# Patient Record
Sex: Male | Born: 1970 | Race: White | Hispanic: No | Marital: Married | Smoking: Never smoker
Health system: Southern US, Community
[De-identification: ages and names within clinical notes are randomized; demographics above are authoritative.]

## PROBLEM LIST (undated history)

## (undated) DIAGNOSIS — T7840XA Allergy, unspecified, initial encounter: Secondary | ICD-10-CM

## (undated) DIAGNOSIS — G4730 Sleep apnea, unspecified: Secondary | ICD-10-CM

## (undated) DIAGNOSIS — K219 Gastro-esophageal reflux disease without esophagitis: Secondary | ICD-10-CM

## (undated) DIAGNOSIS — B001 Herpesviral vesicular dermatitis: Secondary | ICD-10-CM

## (undated) HISTORY — DX: Herpesviral vesicular dermatitis: B00.1

## (undated) HISTORY — PX: APPENDECTOMY: SHX54

## (undated) HISTORY — DX: Allergy, unspecified, initial encounter: T78.40XA

## (undated) HISTORY — DX: Sleep apnea, unspecified: G47.30

## (undated) HISTORY — DX: Gastro-esophageal reflux disease without esophagitis: K21.9

## (undated) HISTORY — PX: NASAL SINUS SURGERY: SHX719

---

## 1998-06-04 ENCOUNTER — Emergency Department (HOSPITAL_COMMUNITY): Admission: EM | Admit: 1998-06-04 | Discharge: 1998-06-04 | Payer: Self-pay | Admitting: Emergency Medicine

## 2001-05-15 ENCOUNTER — Ambulatory Visit (HOSPITAL_BASED_OUTPATIENT_CLINIC_OR_DEPARTMENT_OTHER): Admission: RE | Admit: 2001-05-15 | Discharge: 2001-05-15 | Payer: Self-pay | Admitting: Pulmonary Disease

## 2002-10-24 ENCOUNTER — Emergency Department (HOSPITAL_COMMUNITY): Admission: EM | Admit: 2002-10-24 | Discharge: 2002-10-24 | Payer: Self-pay | Admitting: Emergency Medicine

## 2002-10-24 ENCOUNTER — Encounter: Payer: Self-pay | Admitting: Emergency Medicine

## 2003-08-08 ENCOUNTER — Emergency Department (HOSPITAL_COMMUNITY): Admission: EM | Admit: 2003-08-08 | Discharge: 2003-08-08 | Payer: Self-pay | Admitting: Emergency Medicine

## 2003-11-21 ENCOUNTER — Emergency Department (HOSPITAL_COMMUNITY): Admission: EM | Admit: 2003-11-21 | Discharge: 2003-11-21 | Payer: Self-pay | Admitting: Emergency Medicine

## 2004-02-24 ENCOUNTER — Emergency Department (HOSPITAL_COMMUNITY): Admission: EM | Admit: 2004-02-24 | Discharge: 2004-02-24 | Payer: Self-pay | Admitting: Emergency Medicine

## 2004-11-28 ENCOUNTER — Ambulatory Visit: Payer: Self-pay | Admitting: Pulmonary Disease

## 2005-03-07 ENCOUNTER — Ambulatory Visit: Payer: Self-pay | Admitting: Pulmonary Disease

## 2005-03-27 ENCOUNTER — Ambulatory Visit (HOSPITAL_BASED_OUTPATIENT_CLINIC_OR_DEPARTMENT_OTHER): Admission: RE | Admit: 2005-03-27 | Discharge: 2005-03-27 | Payer: Self-pay | Admitting: Pulmonary Disease

## 2005-04-04 ENCOUNTER — Ambulatory Visit: Payer: Self-pay | Admitting: Pulmonary Disease

## 2005-04-18 ENCOUNTER — Ambulatory Visit: Payer: Self-pay | Admitting: Pulmonary Disease

## 2005-11-29 ENCOUNTER — Ambulatory Visit: Payer: Self-pay | Admitting: Family Medicine

## 2006-02-07 ENCOUNTER — Ambulatory Visit: Payer: Self-pay | Admitting: Family Medicine

## 2006-03-07 ENCOUNTER — Ambulatory Visit: Payer: Self-pay | Admitting: Family Medicine

## 2006-03-14 ENCOUNTER — Ambulatory Visit: Payer: Self-pay | Admitting: Family Medicine

## 2006-04-22 ENCOUNTER — Ambulatory Visit: Payer: Self-pay | Admitting: Family Medicine

## 2006-06-14 ENCOUNTER — Ambulatory Visit: Payer: Self-pay | Admitting: Family Medicine

## 2006-07-01 ENCOUNTER — Ambulatory Visit: Payer: Self-pay | Admitting: Family Medicine

## 2006-09-24 ENCOUNTER — Ambulatory Visit: Payer: Self-pay | Admitting: Family Medicine

## 2007-01-16 ENCOUNTER — Ambulatory Visit: Payer: Self-pay | Admitting: Family Medicine

## 2007-03-27 ENCOUNTER — Ambulatory Visit: Payer: Self-pay | Admitting: Family Medicine

## 2007-04-10 ENCOUNTER — Ambulatory Visit: Payer: Self-pay | Admitting: Family Medicine

## 2007-05-29 ENCOUNTER — Ambulatory Visit: Payer: Self-pay | Admitting: Family Medicine

## 2007-08-19 ENCOUNTER — Ambulatory Visit: Payer: Self-pay | Admitting: Family Medicine

## 2007-10-07 ENCOUNTER — Emergency Department: Admit: 2007-10-07 | Payer: Self-pay | Source: Emergency Department | Admitting: Emergency Medicine

## 2007-10-07 LAB — CBC AND DIFFERENTIAL
Basophils Absolute: 0 /mm3 (ref 0.0–0.2)
Basophils: 0 % (ref 0–2)
Eosinophils Absolute: 0.1 /mm3 (ref 0.0–0.7)
Eosinophils: 2 % (ref 0–5)
Granulocytes Absolute: 3.8 /mm3 (ref 1.8–8.1)
Hematocrit: 42.7 % (ref 42.0–52.0)
Hgb: 14 G/DL (ref 13.0–17.0)
Immature Granulocytes Absolute: 0 CUMM (ref 0.0–0.0)
Immature Granulocytes: 0 % (ref 0–1)
Lymphocytes Absolute: 0.9 /mm3 (ref 0.5–4.4)
Lymphocytes: 17 % (ref 15–41)
MCH: 28.5 PG (ref 28.0–32.0)
MCHC: 32.8 G/DL (ref 32.0–36.0)
MCV: 87 FL (ref 80.0–100.0)
MPV: 9.1 FL — ABNORMAL LOW (ref 9.4–12.3)
Monocytes Absolute: 0.5 /mm3 (ref 0.0–1.2)
Monocytes: 10 % (ref 0–11)
Neutrophils %: 71 % (ref 52–75)
Platelets: 285 /mm3 (ref 140–400)
RBC: 4.91 /mm3 (ref 4.70–6.00)
RDW: 14.2 % (ref 11.5–15.0)
WBC: 5.33 /mm3 (ref 3.50–10.80)

## 2007-10-07 LAB — BASIC METABOLIC PANEL
BUN: 15 MG/DL (ref 7–21)
CO2: 28 MEQ/L (ref 22–31)
Calcium: 9 MG/DL (ref 8.6–10.2)
Chloride: 102 MEQ/L (ref 98–107)
Creatinine: 0.8 MG/DL (ref 0.5–1.4)
Glucose: 97 MG/DL (ref 70–105)
Potassium: 3.8 MEQ/L (ref 3.6–5.0)
Sodium: 140 MEQ/L (ref 136–143)

## 2007-10-07 LAB — GFR

## 2007-10-07 LAB — TROPONIN I QUANTITATIVE LEVEL CERNER
Troponin I: 0.01 ng/mL (ref 0.00–0.03)
Troponin I: 0.01 ng/mL (ref 0.00–0.03)

## 2007-10-07 LAB — PT AND APTT
PT INR: 1.2 {INR} — ABNORMAL HIGH (ref 0.9–1.1)
PT: 14 s — ABNORMAL HIGH (ref 10.8–13.3)
PTT: 26 s (ref 21–32)

## 2007-10-07 LAB — CREATINE KINASE W/O REFLEX (SOFT): Creatine Kinase (CK): 81 U/L (ref 20–297)

## 2008-03-05 ENCOUNTER — Ambulatory Visit: Payer: Self-pay | Admitting: Family Medicine

## 2008-04-30 ENCOUNTER — Ambulatory Visit: Payer: Self-pay | Admitting: Family Medicine

## 2008-07-29 ENCOUNTER — Ambulatory Visit: Payer: Self-pay | Admitting: Family Medicine

## 2008-11-25 ENCOUNTER — Ambulatory Visit: Payer: Self-pay | Admitting: Family Medicine

## 2009-03-28 ENCOUNTER — Encounter: Admission: RE | Admit: 2009-03-28 | Discharge: 2009-03-28 | Payer: Self-pay | Admitting: Family Medicine

## 2009-03-28 ENCOUNTER — Ambulatory Visit: Payer: Self-pay | Admitting: Family Medicine

## 2009-03-29 ENCOUNTER — Encounter: Admission: RE | Admit: 2009-03-29 | Discharge: 2009-03-29 | Payer: Self-pay | Admitting: Family Medicine

## 2009-06-25 ENCOUNTER — Emergency Department: Admit: 2009-06-25 | Payer: Self-pay | Source: Emergency Department | Admitting: Family

## 2009-06-25 LAB — URINALYSIS WITH MICROSCOPIC
Bilirubin, UA: NEGATIVE
Blood, UA: NEGATIVE
Glucose, UA: NEGATIVE
Ketones UA: NEGATIVE
Nitrite, UA: NEGATIVE
Protein, UR: NEGATIVE
Specific Gravity UA POCT: 1.015 (ref 1.001–1.035)
Urine pH: 5 (ref 5.0–8.0)
Urobilinogen, UA: NORMAL mg/dL

## 2009-06-25 LAB — COMPREHENSIVE METABOLIC PANEL
ALT: 38 U/L (ref 21–72)
AST (SGOT): 30 U/L (ref 20–57)
Albumin/Globulin Ratio: 1.3 (ref 1.1–1.8)
Albumin: 3.9 g/dL (ref 3.7–5.1)
Alkaline Phosphatase: 69 U/L (ref 43–122)
BUN: 12 mg/dL (ref 7–21)
Bilirubin, Total: 0.3 mg/dL (ref 0.2–1.3)
CO2: 29 mEq/L (ref 22–31)
Calcium: 9.1 mg/dL (ref 8.6–10.2)
Chloride: 100 mEq/L (ref 98–107)
Creatinine: 1 mg/dL (ref 0.5–1.4)
Globulin: 3.1 g/dL (ref 2.0–3.7)
Glucose: 88 mg/dL (ref 70–100)
Potassium: 4 mEq/L (ref 3.6–5.0)
Protein, Total: 7 g/dL (ref 6.0–8.0)
Sodium: 140 mEq/L (ref 136–143)

## 2009-06-25 LAB — CBC AND DIFFERENTIAL
Basophils %: 0 % (ref 0–2)
Basophils Absolute: 0 10*3/uL (ref 0.00–0.20)
Eosinophils %: 3 % (ref 0–5)
Eosinophils Absolute: 0.2 10*3/uL (ref 0.00–0.70)
Granulocytes Absolute: 3.5 10*3/uL (ref 1.80–8.10)
Hematocrit: 42.9 % (ref 42.0–52.0)
Hgb: 14.4 g/dL (ref 13.0–17.0)
Immature Granulocytes Absolute: 0.01 10*3/uL
Immature Granulocytes: 0 % (ref 0–1)
Lymphocytes Absolute: 1 10*3/uL (ref 0.50–4.40)
Lymphocytes: 18 % (ref 15–41)
MCH: 29.3 pg (ref 28.0–32.0)
MCHC: 33.6 g/dL (ref 32.0–36.0)
MCV: 87.2 fL (ref 80.0–100.0)
MPV: 9.1 fL — ABNORMAL LOW (ref 9.4–12.3)
Monocytes Absolute: 0.9 10*3/uL (ref 0.00–1.20)
Monocytes: 16 % — ABNORMAL HIGH (ref 0–11)
Neutrophils %: 63 % (ref 52–75)
Platelets: 257 10*3/uL (ref 140–400)
RBC: 4.92 10*6/uL (ref 4.70–6.00)
RDW: 14 % (ref 12–15)
WBC: 5.56 10*3/uL (ref 3.50–10.80)

## 2009-06-25 LAB — GFR: EGFR: >60

## 2009-06-25 LAB — LIPASE: Lipase: 103 U/L (ref 23–300)

## 2009-06-25 LAB — AMYLASE: Amylase: 37 U/L (ref 30–110)

## 2009-08-22 ENCOUNTER — Ambulatory Visit: Payer: Self-pay | Admitting: Family Medicine

## 2009-09-15 ENCOUNTER — Ambulatory Visit: Payer: Self-pay | Admitting: Family Medicine

## 2009-09-22 ENCOUNTER — Ambulatory Visit: Payer: Self-pay | Admitting: Family Medicine

## 2009-11-17 ENCOUNTER — Ambulatory Visit: Payer: Self-pay | Admitting: Family Medicine

## 2010-01-04 ENCOUNTER — Ambulatory Visit: Payer: Self-pay | Admitting: Family Medicine

## 2010-04-21 ENCOUNTER — Ambulatory Visit: Payer: Self-pay | Admitting: Family Medicine

## 2010-05-29 ENCOUNTER — Ambulatory Visit: Payer: Self-pay | Admitting: Family Medicine

## 2010-06-20 ENCOUNTER — Ambulatory Visit
Admission: RE | Admit: 2010-06-20 | Discharge: 2010-06-20 | Payer: Self-pay | Source: Home / Self Care | Attending: Family Medicine | Admitting: Family Medicine

## 2010-07-24 ENCOUNTER — Ambulatory Visit
Admission: RE | Admit: 2010-07-24 | Discharge: 2010-07-24 | Payer: Self-pay | Source: Home / Self Care | Attending: Family Medicine | Admitting: Family Medicine

## 2010-11-10 NOTE — Procedures (Signed)
Alan Grimes, Alan Grimes NO.:  [id number]   MEDICAL RECORD NO.:  [id number]          PATIENT TYPE:  OUT   LOCATION:  SLEEP CENTER                 FACILITY:  [facility]   PHYSICIAN:  Marcelyn Bruins, M.D. [facility] DATE OF BIRTH:  1971/03/26   DATE OF STUDY:  03/27/2005                              NOCTURNAL POLYSOMNOGRAM   REFERRING PHYSICIAN:  Danice Goltz, M.D.   DATE OF STUDY:  March 27, 2005.   INDICATIONS FOR STUDY:  Hypersomnia with some sleep apnea.   EPWORTH SCORE:  6.   SLEEP ARCHITECTURE:  The patient has total sleep time of 383 minutes with  adequate slow wave sleep and REM in lower limits of normal. Sleep onset  latency was at the upper limits of normal, and REM onset was prolonged.  Sleep efficiency was 91%.   RESPIRATORY DATA:  The patient was found to have apneas and hypopneas for a  respiratory disturbance index 18 events per hour. These occurred in all body  positions, and there was loud snoring noted.   OXYGEN DATA:  The patient had O2 desaturation as low as 88% associated with  his obstructive events.   CARDIAC DATA:  No clinically significant cardiac arrhythmias.   MOVEMENT/PARASOMNIAS:  The patient was found to have small numbers of leg  jerks with very mild sleep disruption. Clinical correlation is suggested if  he does not respond appropriately to treatment of his obstructive sleep  apnea.   IMPRESSION:  1.  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  Treatment options include weigh loss, upper airway surgery,  oral appliance, or CPAP.           ______________________________  Marcelyn Bruins, M.D. [facility]  Diplomate, American Board of Sleep  Medicine     KC/MEDQ  D:  04/04/2005 12:17:19  T:  04/04/2005 14:08:21  Job:  [id number]

## 2010-11-23 ENCOUNTER — Encounter: Payer: Self-pay | Admitting: Family Medicine

## 2010-11-23 DIAGNOSIS — G4730 Sleep apnea, unspecified: Secondary | ICD-10-CM | POA: Insufficient documentation

## 2010-11-24 ENCOUNTER — Encounter: Payer: Self-pay | Admitting: Family Medicine

## 2010-11-24 ENCOUNTER — Ambulatory Visit (INDEPENDENT_AMBULATORY_CARE_PROVIDER_SITE_OTHER): Payer: Managed Care, Other (non HMO) | Admitting: Family Medicine

## 2010-11-24 ENCOUNTER — Telehealth: Payer: Self-pay | Admitting: Family Medicine

## 2010-11-24 VITALS — BP 120/82 | HR 60 | Ht 69.0 in | Wt 179.0 lb

## 2010-11-24 DIAGNOSIS — J301 Allergic rhinitis due to pollen: Secondary | ICD-10-CM

## 2010-11-24 DIAGNOSIS — Z0000 Encounter for general adult medical examination without abnormal findings: Secondary | ICD-10-CM

## 2010-11-24 DIAGNOSIS — G4730 Sleep apnea, unspecified: Secondary | ICD-10-CM

## 2010-11-24 DIAGNOSIS — J45909 Unspecified asthma, uncomplicated: Secondary | ICD-10-CM

## 2010-11-24 DIAGNOSIS — A6000 Herpesviral infection of urogenital system, unspecified: Secondary | ICD-10-CM

## 2010-11-24 DIAGNOSIS — A6002 Herpesviral infection of other male genital organs: Secondary | ICD-10-CM

## 2010-11-24 DIAGNOSIS — K219 Gastro-esophageal reflux disease without esophagitis: Secondary | ICD-10-CM

## 2010-11-24 LAB — CBC WITH DIFFERENTIAL/PLATELET
Basophils Relative: 1 % (ref 0–1)
HCT: 43.5 % (ref 39.0–52.0)
Hemoglobin: 14.7 g/dL (ref 13.0–17.0)
Lymphocytes Relative: 25 % (ref 12–46)
Lymphs Abs: 1.6 10*3/uL (ref 0.7–4.0)
MCHC: 33.8 g/dL (ref 30.0–36.0)
Monocytes Absolute: 0.6 10*3/uL (ref 0.1–1.0)
Monocytes Relative: 9 % (ref 3–12)
Neutro Abs: 3.4 10*3/uL (ref 1.7–7.7)
Neutrophils Relative %: 54 % (ref 43–77)
RBC: 5.05 MIL/uL (ref 4.22–5.81)

## 2010-11-24 LAB — COMPREHENSIVE METABOLIC PANEL
Albumin: 4.4 g/dL (ref 3.5–5.2)
BUN: 17 mg/dL (ref 6–23)
CO2: 28 mEq/L (ref 19–32)
Calcium: 9.9 mg/dL (ref 8.4–10.5)
Chloride: 104 mEq/L (ref 96–112)
Glucose, Bld: 87 mg/dL (ref 70–99)
Potassium: 4.2 mEq/L (ref 3.5–5.3)
Sodium: 140 mEq/L (ref 135–145)
Total Protein: 7.3 g/dL (ref 6.0–8.3)

## 2010-11-24 LAB — POCT URINALYSIS DIPSTICK
Glucose, UA: NEGATIVE
Ketones, UA: NEGATIVE
Leukocytes, UA: NEGATIVE
Protein, UA: NEGATIVE
Spec Grav, UA: 1.02

## 2010-11-24 LAB — LIPID PANEL
Cholesterol: 177 mg/dL (ref 0–200)
HDL: 41 mg/dL (ref >39)
Triglycerides: 135 mg/dL (ref <150)

## 2010-11-24 MED ORDER — BUDESONIDE 32 MCG/ACT NA SUSP: 2.0000 | Freq: Every day | NASAL | Status: DC

## 2010-11-24 MED ORDER — FLUTICASONE-SALMETEROL 250-50 MCG/DOSE IN AEPB: 1.0000 | INHALATION_SPRAY | Freq: Two times a day (BID) | RESPIRATORY_TRACT | Status: DC

## 2010-11-24 NOTE — Progress Notes (Signed)
  Subjective:    Patient ID: Alan Grimes, male    DOB: 05/11/1971, 40 y.o.   MRN: [id number]  HPI He is here for complete examination. He does have history of allergies and asthma but at the present time is not using his nasal steroid spray or his Advair. He also has a history of sleep apnea and is not using CPAP do to intolerance to this. 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 He also continues on Celexa and Wellbutrin which is going well. He continues on medicines for his reflux. He is now in a long-term relationship which seems to be going well . He still travels with his job however he is having difficulty with weight maintenance.   Review of Systems  Constitutional: Negative.   Eyes: Negative.   Cardiovascular: Negative.   Gastrointestinal: Negative.   Genitourinary: Negative.   Musculoskeletal: Negative.   Neurological: Negative.   Hematological: Negative.        Objective:   Physical ExamBP 120/82  Pulse 60  Ht 5\' 9"  (1.753 m)  Wt 179 lb (81.194 kg)  BMI 26.43 kg/m2  General Appearance:    Alert, cooperative, no distress, appears stated age  Head:    Normocephalic, without obvious abnormality, atraumatic  Eyes:    PERRL, conjunctiva/corneas clear, EOM's intact, fundi    benign  Ears:    both TMs have right changes   Nose:   Nares normal, mucosa normal, no drainage or sinus   tenderness  Throat:   Lips, mucosa, and tongue normal; teeth and gums normal  Neck:   Supple, no lymphadenopathy;  thyroid:  no   enlargement/tenderness/nodules; no carotid   bruit or JVD  Back:    Spine nontender, no curvature, ROM normal, no CVA     tenderness  Lungs:     show scattered rhonchi ,respirations unlabored  Chest Wall:    No tenderness or deformity   Heart:    Regular rate and rhythm, S1 and S2 normal, no murmur, rub   or gallop  Breast Exam:    No chest wall tenderness, masses or gynecomastia  Abdomen:     Soft, non-tender, nondistended,  normoactive bowel sounds,    no masses, no hepatosplenomegaly  Genitalia:    Normal male external genitalia without lesions.  Testicles without masses.  No inguinal hernias.  Rectal:    Normal sphincter tone, no masses or tenderness; guaiac negative stool.  Prostate smooth, no nodules, not enlarged.  Extremities:   No clubbing, cyanosis or edema  Pulses:   2+ and symmetric all extremities  Skin:   Skin color, texture, turgor normal, no rashes or lesions  Lymph nodes:   Cervical, supraclavicular, and axillary nodes normal  Neurologic:   CNII-XII intact, normal strength, sensation and gait; reflexes 2+ and symmetric throughout          Psych:   Normal mood, affect, hygiene and grooming.            Assessment & Plan:  Allergic rhinitis. Asthma. GERD. Herpes labialis. Sleep apnea. I discussed weight gain with him in regard to diet and exercise. Strongly encouraged to make changes in both areas. I will do routine blood screening on him. I will also renew his Advair which he has stopped on his own and his Rhinocort. 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 At this point he also does not want to pursue sleep apnea testing or CPAP.

## 2011-02-13 NOTE — Telephone Encounter (Signed)
NOTED

## 2011-03-15 ENCOUNTER — Telehealth: Payer: Self-pay | Admitting: Family Medicine

## 2011-03-16 ENCOUNTER — Other Ambulatory Visit: Payer: Self-pay | Admitting: *Deleted

## 2011-03-16 NOTE — Telephone Encounter (Signed)
Called in Albuterol Sulfate inhaler, 1-2 puffs as needed with no refills to [facility] [place] at [phone].  CM, LPN

## 2011-03-19 ENCOUNTER — Other Ambulatory Visit: Payer: Self-pay

## 2011-03-19 MED ORDER — ALBUTEROL SULFATE HFA 108 (90 BASE) MCG/ACT IN AERS: 2.0000 | INHALATION_SPRAY | Freq: Four times a day (QID) | RESPIRATORY_TRACT | Status: DC | PRN

## 2011-03-19 NOTE — Telephone Encounter (Signed)
surescripte pt albuteral to [facility]

## 2011-03-20 NOTE — Telephone Encounter (Signed)
DONE

## 2011-03-24 IMAGING — CR DG CHEST 2V
2 series · 2 of 2 positions shown · non-contrast
Comparison: 11/21/2003

CLINICAL DATA: Fatigue, cough and chest pain.

CHEST - 2 VIEW

[w chest pa]
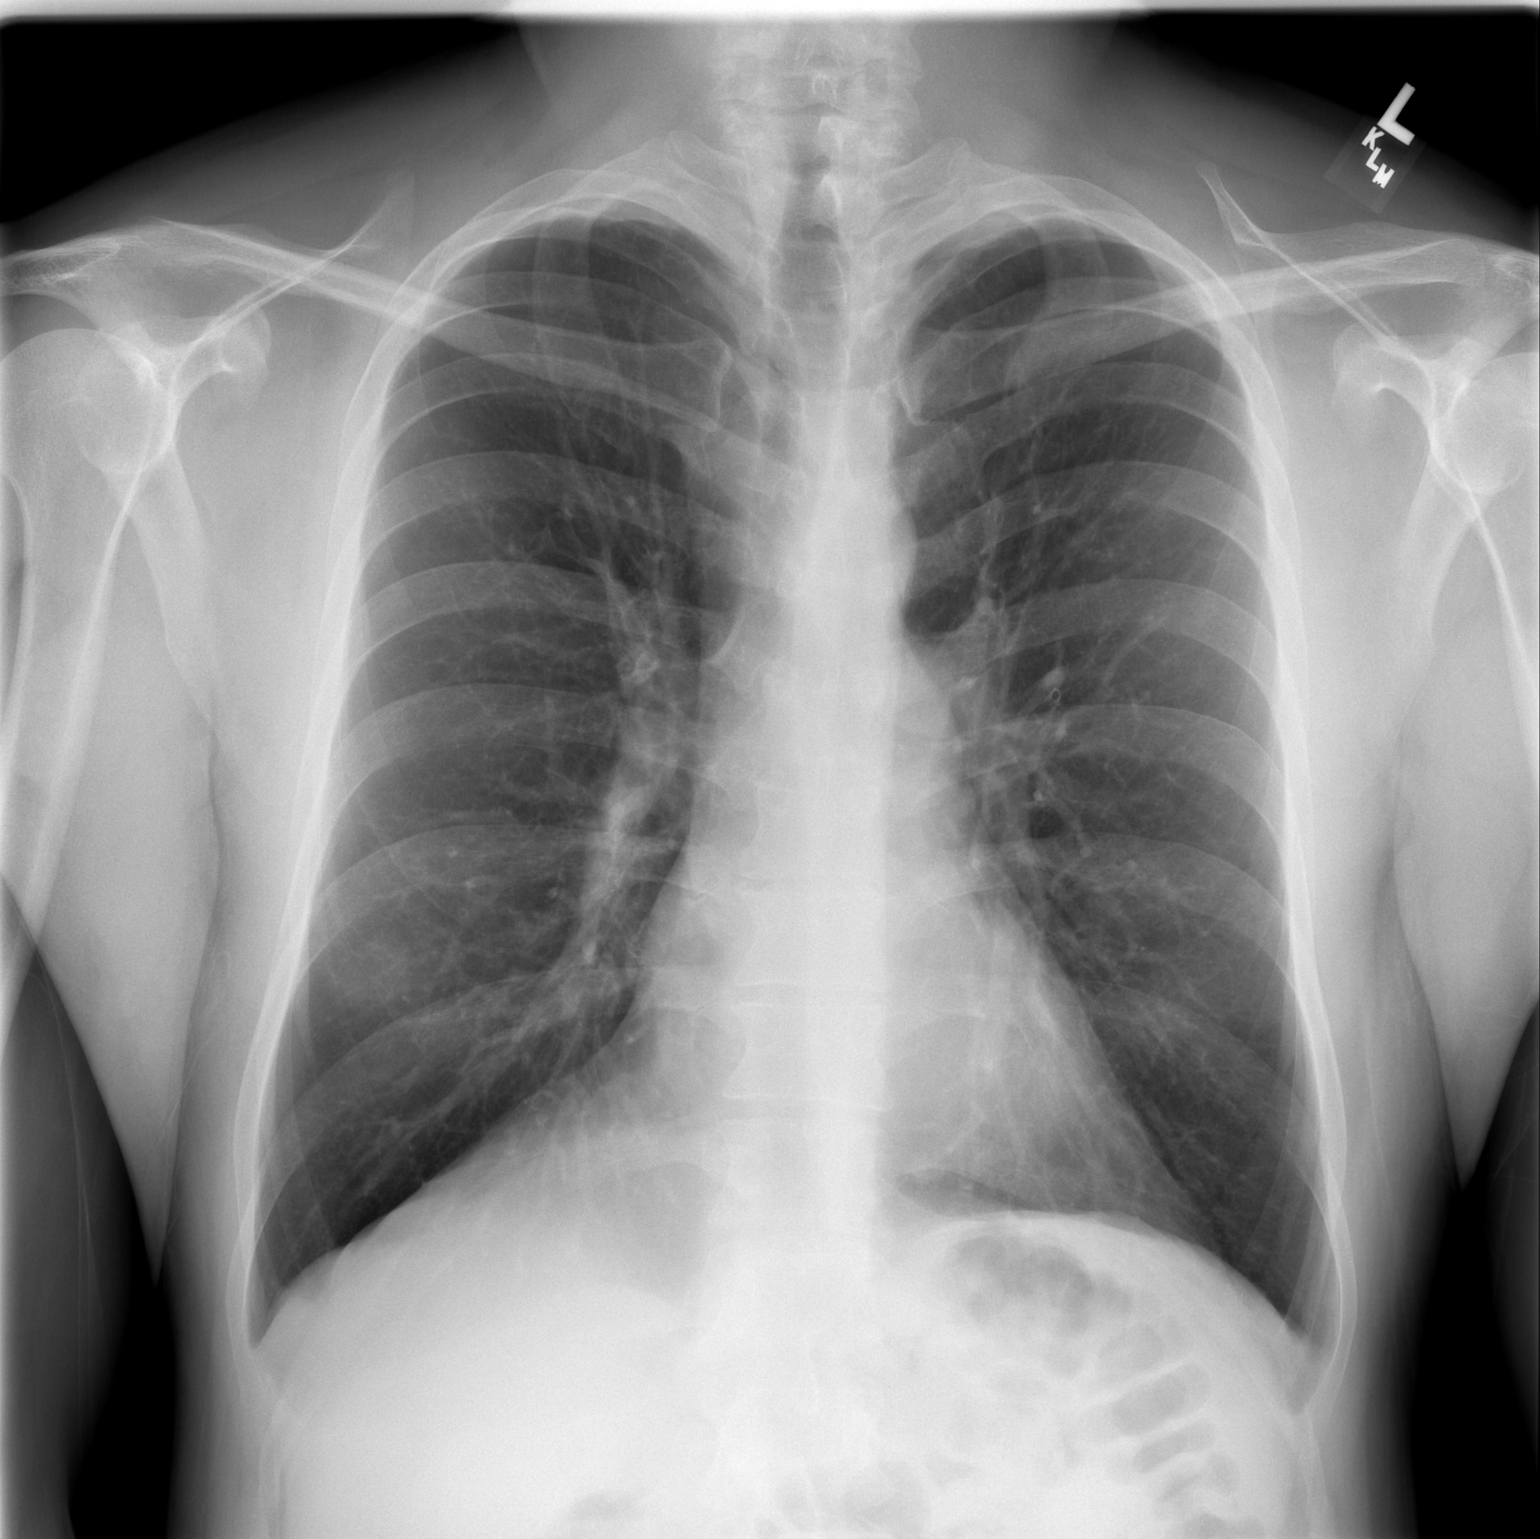

[w chest lat]
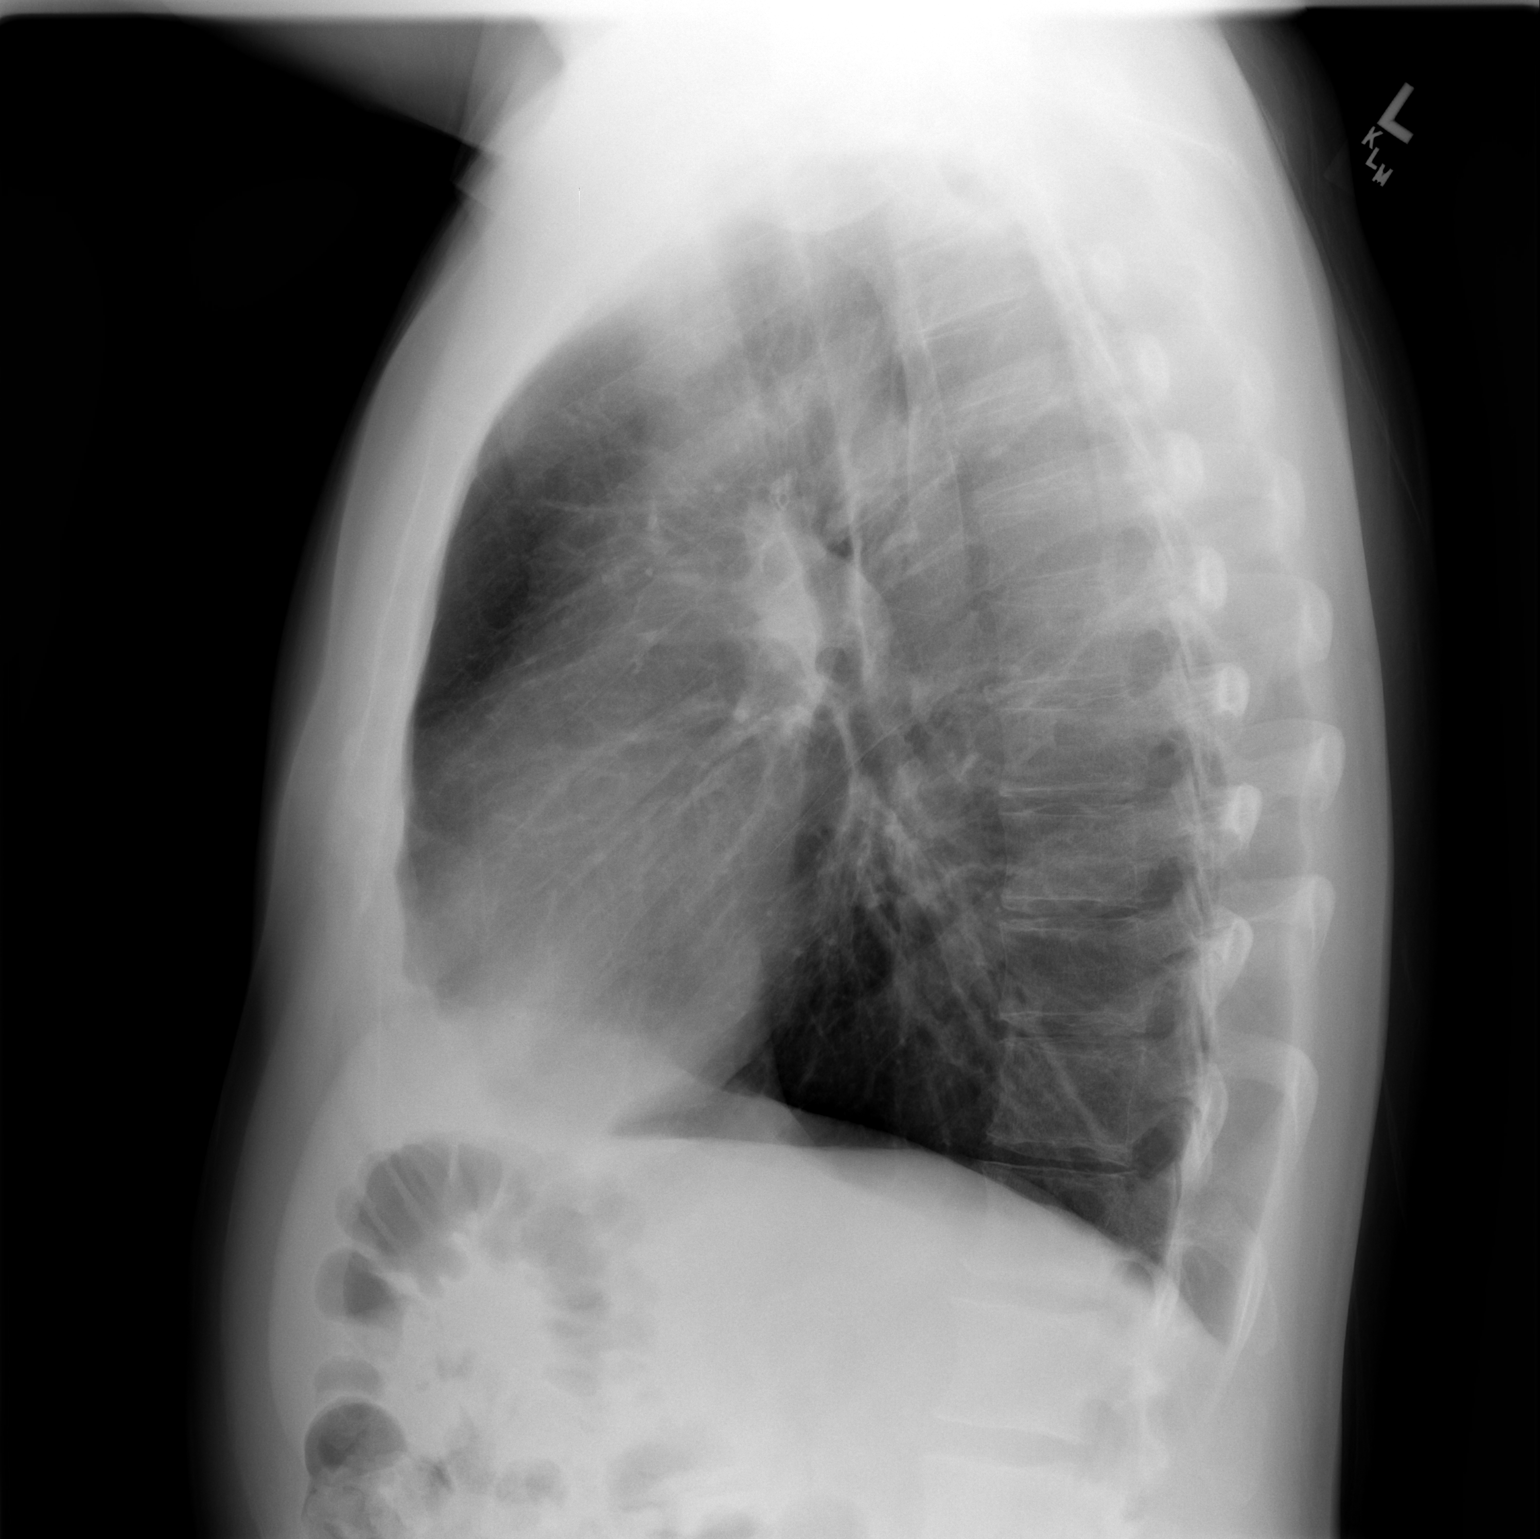

[2 of 2 positions shown; findings below may reference images not displayed]

FINDINGS: Trachea is midline.  Heart size normal.  Mild biapical
pleural thickening.  A rounded density is seen in the right lower
lung zone, measuring approximately 2 cm.  It does not have the
usual configuration of a nipple shadow.  Mild flattening of the
hemidiaphragms.  No pleural fluid.
IMPRESSION: Nodular density at the base of the right hemithorax does not have
the usual appearance of a nipple shadow.  Pulmonary nodule is
considered.  Chest CT is recommended. This was called to [facility], in
Dr. [REDACTED], by Leszek, at [DATE] p.m. on 03/28/2009.

## 2011-03-25 LAB — ECG 12-LEAD
Atrial Rate: 81 {beats}/min
P Axis: 45 degrees
P-R Interval: 134 ms
Q-T Interval: 368 ms
QRS Duration: 80 ms
QTC Calculation (Bezet): 427 ms
R Axis: -1 degrees
T Axis: 50 degrees
Ventricular Rate: 81 {beats}/min

## 2011-03-29 ENCOUNTER — Ambulatory Visit (INDEPENDENT_AMBULATORY_CARE_PROVIDER_SITE_OTHER): Payer: Managed Care, Other (non HMO) | Admitting: Family Medicine

## 2011-03-29 ENCOUNTER — Encounter: Payer: Self-pay | Admitting: Family Medicine

## 2011-03-29 ENCOUNTER — Other Ambulatory Visit: Payer: Self-pay

## 2011-03-29 DIAGNOSIS — J45909 Unspecified asthma, uncomplicated: Secondary | ICD-10-CM

## 2011-03-29 DIAGNOSIS — Z111 Encounter for screening for respiratory tuberculosis: Secondary | ICD-10-CM

## 2011-03-29 DIAGNOSIS — Z0000 Encounter for general adult medical examination without abnormal findings: Secondary | ICD-10-CM

## 2011-03-29 DIAGNOSIS — R6882 Decreased libido: Secondary | ICD-10-CM

## 2011-03-29 DIAGNOSIS — R5383 Other fatigue: Secondary | ICD-10-CM

## 2011-03-29 DIAGNOSIS — Z23 Encounter for immunization: Secondary | ICD-10-CM

## 2011-03-29 DIAGNOSIS — R5381 Other malaise: Secondary | ICD-10-CM

## 2011-03-29 LAB — COMPREHENSIVE METABOLIC PANEL
ALT: 20 U/L (ref 0–53)
AST: 19 U/L (ref 0–37)
Albumin: 4.5 g/dL (ref 3.5–5.2)
CO2: 27 mEq/L (ref 19–32)
Calcium: 9.8 mg/dL (ref 8.4–10.5)
Chloride: 101 mEq/L (ref 96–112)
Potassium: 4.1 mEq/L (ref 3.5–5.3)
Total Protein: 7.6 g/dL (ref 6.0–8.3)

## 2011-03-29 LAB — CBC WITH DIFFERENTIAL/PLATELET
Basophils Absolute: 0.1 10*3/uL (ref 0.0–0.1)
Eosinophils Relative: 11 % — ABNORMAL HIGH (ref 0–5)
HCT: 43 % (ref 39.0–52.0)
Lymphocytes Relative: 29 % (ref 12–46)
Lymphs Abs: 1.7 10*3/uL (ref 0.7–4.0)
MCV: 87.2 fL (ref 78.0–100.0)
Monocytes Absolute: 0.7 10*3/uL (ref 0.1–1.0)
Neutro Abs: 2.6 10*3/uL (ref 1.7–7.7)
Platelets: 366 10*3/uL (ref 150–400)
RBC: 4.93 MIL/uL (ref 4.22–5.81)
RDW: 14.5 % (ref 11.5–15.5)
WBC: 5.7 10*3/uL (ref 4.0–10.5)

## 2011-03-29 LAB — TESTOSTERONE: Testosterone: 452.42 ng/dL (ref 250–890)

## 2011-03-29 MED ORDER — AMOXICILLIN-POT CLAVULANATE 875-125 MG PO TABS: 1.0000 | ORAL_TABLET | Freq: Two times a day (BID) | ORAL | Status: DC

## 2011-03-29 MED ORDER — AMOXICILLIN-POT CLAVULANATE 875-125 MG PO TABS: 1.0000 | ORAL_TABLET | Freq: Two times a day (BID) | ORAL | Status: AC

## 2011-03-29 NOTE — Progress Notes (Signed)
resent

## 2011-03-29 NOTE — Telephone Encounter (Signed)
The first rx didn't go through

## 2011-03-29 NOTE — Progress Notes (Signed)
  Subjective:    Patient ID: Alan Grimes, male    DOB: Aug 11, 1970, 40 y.o.   MRN: [id number]  HPI He is here for multiple issues. He needs his immunizations updated. He also needs a PPD for work. He complains of fatigue, decreased libido for last 6-8 months. He now has a two-week history of chest congestion with productive cough. He does have underlying allergies and asthma. He's been using his inhaler more often recently. Also of note is that he is now married.   Review of Systems     Objective:   Physical Exam alert and in no distress. Tympanic membranes and canals are normal. Throat is clear. Tonsils are normal. Neck is supple without adenopathy or thyromegaly. Cardiac exam shows a regular sinus rhythm without murmurs or gallops. Lungs show expiratory wheezing        Assessment & Plan:   1. Routine adult health maintenance  TB Skin Test  2. Asthmatic bronchitis    3. Decreased libido  CBC with Differential, Comprehensive metabolic panel, Testosterone  4. Fatigue  TSH   TDaP given

## 2011-03-30 NOTE — Progress Notes (Signed)
Addended by: Shaune Spittle D on: 03/30/2011 02:09 PM   Modules accepted: Level of Service

## 2011-04-02 ENCOUNTER — Telehealth: Payer: Self-pay | Admitting: Family Medicine

## 2011-04-02 NOTE — Telephone Encounter (Signed)
Dr.Lalonde how do you want me to do this

## 2011-04-02 NOTE — Telephone Encounter (Signed)
Mark PPD down as negative. He is very reliable and would be beating my door down if his test was positive!

## 2011-04-02 NOTE — Telephone Encounter (Signed)
Faxed copy to the 1 [phone] per pt and jcl

## 2011-05-21 ENCOUNTER — Telehealth: Payer: Self-pay | Admitting: Family Medicine

## 2011-05-21 MED ORDER — ALBUTEROL SULFATE HFA 108 (90 BASE) MCG/ACT IN AERS: 2.0000 | INHALATION_SPRAY | Freq: Four times a day (QID) | RESPIRATORY_TRACT | Status: DC | PRN

## 2011-05-21 NOTE — Telephone Encounter (Signed)
Is this okay to refill? 

## 2011-05-21 NOTE — Telephone Encounter (Signed)
Ventolin faxed.

## 2011-05-29 ENCOUNTER — Other Ambulatory Visit: Payer: Self-pay

## 2011-05-29 ENCOUNTER — Other Ambulatory Visit: Payer: Self-pay | Admitting: Internal Medicine

## 2011-05-29 MED ORDER — CITALOPRAM HYDROBROMIDE 40 MG PO TABS: 40.0000 mg | ORAL_TABLET | Freq: Every day | ORAL | Status: DC

## 2011-05-29 MED ORDER — PANTOPRAZOLE SODIUM 40 MG PO TBEC: 40.0000 mg | DELAYED_RELEASE_TABLET | Freq: Every day | ORAL | Status: DC

## 2011-07-23 ENCOUNTER — Telehealth: Payer: Self-pay | Admitting: Internal Medicine

## 2011-07-23 MED ORDER — BUPROPION HCL ER (SR) 150 MG PO TB12: 150.0000 mg | ORAL_TABLET | Freq: Every day | ORAL | Status: DC

## 2011-07-23 NOTE — Telephone Encounter (Signed)
Wellbutrin renewed.

## 2011-08-22 ENCOUNTER — Telehealth: Payer: Self-pay | Admitting: Family Medicine

## 2011-08-22 NOTE — Telephone Encounter (Signed)
DONE

## 2011-09-03 ENCOUNTER — Telehealth: Payer: Self-pay | Admitting: Family Medicine

## 2011-09-03 MED ORDER — AZITHROMYCIN 500 MG PO TABS: 500.0000 mg | ORAL_TABLET | Freq: Every day | ORAL | Status: AC

## 2011-09-03 NOTE — Telephone Encounter (Signed)
Azithromycin called in. Patient still having difficulty with cough and congestion.

## 2011-09-05 ENCOUNTER — Ambulatory Visit: Payer: Managed Care, Other (non HMO) | Admitting: Family Medicine

## 2011-09-10 DIAGNOSIS — Z0279 Encounter for issue of other medical certificate: Secondary | ICD-10-CM

## 2011-09-25 ENCOUNTER — Telehealth: Payer: Self-pay | Admitting: Internal Medicine

## 2011-09-26 ENCOUNTER — Ambulatory Visit (INDEPENDENT_AMBULATORY_CARE_PROVIDER_SITE_OTHER): Payer: Managed Care, Other (non HMO) | Admitting: Family Medicine

## 2011-09-26 DIAGNOSIS — J019 Acute sinusitis, unspecified: Secondary | ICD-10-CM

## 2011-09-26 DIAGNOSIS — M719 Bursopathy, unspecified: Secondary | ICD-10-CM

## 2011-09-26 DIAGNOSIS — M7552 Bursitis of left shoulder: Secondary | ICD-10-CM

## 2011-09-26 DIAGNOSIS — M67919 Unspecified disorder of synovium and tendon, unspecified shoulder: Secondary | ICD-10-CM

## 2011-09-26 NOTE — Progress Notes (Signed)
  Subjective:    Patient ID: Alan Grimes, male    DOB: 07-08-1970, 41 y.o.   MRN: [id number]  HPI Approximately 2 weeks ago he was placed on azithromycin for treatment of a sinus infection. This did not help and he was subsequently switched to Levaquin. He has been on Levaquin for 7 days and is still having difficulty with nasal congestion, postnasal drainage, sore throat and now chest congestion with coughing. He was placed on Levaquin by Dr. Haroldine Laws and also given Allegra-D. He is scheduled for a CT scan. He has a several month history of left shoulder pain. He was seen by Dr. Prince Rome.   Review of Systems     Objective:   Physical Exam alert and in no distress. Tympanic membranes show bilateral scarring, canals are normal. Throat is erythematous with posterior pharyngeal adenopathy. Tonsils are observed. Neck is supple without adenopathy or thyromegaly. Cardiac exam shows a regular sinus rhythm without murmurs or gallops. Lungs are clear to auscultation. Sinuses are nontender. Left shoulder exam does full motion. No laxity noted. Hawkins and Neer test negative. Supraspinatus testing was uncomfortable.        Assessment & Plan:   1. Acute sinusitis  CT Maxillofacial WO CM  2. Bursitis of left shoulder     Discussed treatment of the shoulder. We decided to inject him. 40 mg of Kenalog and 3 cc of Xylocaine was injected into the subacromial bursa without difficulty. He will let me know how this works.

## 2011-09-27 ENCOUNTER — Ambulatory Visit
Admission: RE | Admit: 2011-09-27 | Discharge: 2011-09-27 | Disposition: A | Discharge status: Home / Self Care | Payer: Managed Care, Other (non HMO) | Source: Ambulatory Visit | Attending: Family Medicine | Admitting: Family Medicine

## 2011-09-27 ENCOUNTER — Telehealth: Payer: Self-pay | Admitting: Internal Medicine

## 2011-09-27 MED ORDER — ACYCLOVIR 200 MG PO CAPS: ORAL_CAPSULE | ORAL | Status: DC

## 2011-09-27 NOTE — Telephone Encounter (Signed)
Go ahead and renew this. Check the amount given in his record and repeat that

## 2011-09-27 NOTE — Telephone Encounter (Signed)
Sent med in looked in chart pt takes 2 caps 2 times a day

## 2011-09-27 NOTE — Telephone Encounter (Signed)
sl

## 2011-10-30 ENCOUNTER — Other Ambulatory Visit: Payer: Self-pay

## 2011-10-30 MED ORDER — BUPROPION HCL ER (SR) 150 MG PO TB12: 150.0000 mg | ORAL_TABLET | Freq: Every day | ORAL | Status: DC

## 2011-11-13 ENCOUNTER — Ambulatory Visit (INDEPENDENT_AMBULATORY_CARE_PROVIDER_SITE_OTHER): Payer: Managed Care, Other (non HMO) | Admitting: Family Medicine

## 2011-11-13 ENCOUNTER — Encounter: Payer: Self-pay | Admitting: Family Medicine

## 2011-11-13 VITALS — BP 112/72 | HR 72 | Ht 70.0 in | Wt 175.0 lb

## 2011-11-13 DIAGNOSIS — F341 Dysthymic disorder: Secondary | ICD-10-CM | POA: Insufficient documentation

## 2011-11-13 DIAGNOSIS — K219 Gastro-esophageal reflux disease without esophagitis: Secondary | ICD-10-CM

## 2011-11-13 DIAGNOSIS — J45909 Unspecified asthma, uncomplicated: Secondary | ICD-10-CM

## 2011-11-13 DIAGNOSIS — G4730 Sleep apnea, unspecified: Secondary | ICD-10-CM

## 2011-11-13 DIAGNOSIS — J301 Allergic rhinitis due to pollen: Secondary | ICD-10-CM

## 2011-11-13 DIAGNOSIS — A6000 Herpesviral infection of urogenital system, unspecified: Secondary | ICD-10-CM

## 2011-11-13 DIAGNOSIS — Z0000 Encounter for general adult medical examination without abnormal findings: Secondary | ICD-10-CM

## 2011-11-13 DIAGNOSIS — A6002 Herpesviral infection of other male genital organs: Secondary | ICD-10-CM

## 2011-11-13 LAB — HEMOCCULT GUIAC POC 1CARD (OFFICE)

## 2011-11-13 LAB — POCT URINALYSIS DIPSTICK
Bilirubin, UA: NEGATIVE
Blood, UA: NEGATIVE
Ketones, UA: NEGATIVE
Protein, UA: NEGATIVE
Spec Grav, UA: >=1.03
pH, UA: 5

## 2011-11-13 MED ORDER — FEXOFENADINE-PSEUDOEPHED ER 60-120 MG PO TB12: 1.0000 | ORAL_TABLET | Freq: Two times a day (BID) | ORAL | Status: AC

## 2011-11-13 NOTE — Progress Notes (Signed)
Subjective:    Patient ID: Alan Grimes, male    DOB: 10-12-1970, 41 y.o.   MRN: [id number]  HPI He is here for complete examination. He continues to do well on Protonix for his GERD. He continues on Valtrex to help treat his herpes genitalis and has not had an operating recently. His allergies and asthma seem to be under good control. He would like to be switched to Allegra-D which he says works well for his allergies. He does have a history of sleep apnea however this was diagnosed prior to him having 3 nasal surgeries. He would like to have another sleep study but apparently recently was diagnosed with having another nasal polyp. He continues to do well on his Wellbutrin and Celexa to help stabilize his mood. 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 He is now married and things seem to going quite well with that. Family and social history was reviewed.  Review of Systems Negative except as above    Objective:   Physical Exam BP 112/72  Pulse 72  Ht 5\' 10"  (1.778 m)  Wt 175 lb (79.379 kg)  BMI 25.11 kg/m2  General Appearance:    Alert, cooperative, no distress, appears stated age  Head:    Normocephalic, without obvious abnormality, atraumatic  Eyes:    PERRL, conjunctiva/corneas clear, EOM's intact, fundi    benign  Ears:    Normal TM's and external ear canals  Nose:   Nares normal, mucosa normal, no drainage or sinus   tenderness  Throat:   Lips, mucosa, and tongue normal; teeth and gums normal  Neck:   Supple, no lymphadenopathy;  thyroid:  no   enlargement/tenderness/nodules; no carotid   bruit or JVD  Back:    Spine nontender, no curvature, ROM normal, no CVA     tenderness  Lungs:     Clear to auscultation bilaterally without wheezes, rales or     ronchi; respirations unlabored  Chest Wall:    No tenderness or deformity   Heart:    Regular rate and rhythm, S1 and S2 normal, no murmur, rub   or gallop  Breast Exam:    No chest wall tenderness, masses or  gynecomastia  Abdomen:     Soft, non-tender, nondistended, normoactive bowel sounds,    no masses, no hepatosplenomegaly  Genitalia:    Normal male external genitalia without lesions.  Testicles without masses.  No inguinal hernias.  Rectal:    Normal sphincter tone, no masses or tenderness; guaiac negative stool.  Prostate smooth, no nodules, not enlarged.  Extremities:   No clubbing, cyanosis or edema  Pulses:   2+ and symmetric all extremities  Skin:   Skin color, texture, turgor normal, no rashes or lesions  Lymph nodes:   Cervical, supraclavicular, and axillary nodes normal  Neurologic:   CNII-XII intact, normal strength, sensation and gait; reflexes 2+ and symmetric throughout          Psych:   Normal mood, affect, hygiene and grooming.          Assessment & Plan:   1. GERD (gastroesophageal reflux disease)    2. Herpes genitalis in men    3. Asthma    4. Allergic rhinitis due to pollen  fexofenadine-pseudoephedrine (ALLEGRA-D 12 HOUR) 60-120 MG per tablet  5. Sleep apnea    6. Dysthymia    7. Routine general medical examination at a health care facility  POCT Urinalysis Dipstick, Hemoccult - 1 Card (office)  discuss getting another sleep study but will wait to hear what is going to happen with the nasal polyp. He would like to see if the nasal surgery has mitigated the need for the CPAP. He essentially stopped using this for social reasons.

## 2012-01-11 ENCOUNTER — Other Ambulatory Visit: Payer: Self-pay

## 2012-01-11 MED ORDER — PANTOPRAZOLE SODIUM 40 MG PO TBEC: 40.0000 mg | DELAYED_RELEASE_TABLET | Freq: Every day | ORAL | Status: DC

## 2012-01-11 NOTE — Telephone Encounter (Signed)
Sent med in per fax 

## 2012-01-15 ENCOUNTER — Telehealth: Payer: Self-pay | Admitting: Family Medicine

## 2012-01-15 MED ORDER — CITALOPRAM HYDROBROMIDE 40 MG PO TABS: 40.0000 mg | ORAL_TABLET | Freq: Every day | ORAL | Status: DC

## 2012-01-15 NOTE — Telephone Encounter (Signed)
Celexa renewed 

## 2012-01-28 ENCOUNTER — Other Ambulatory Visit: Payer: Self-pay

## 2012-01-28 ENCOUNTER — Telehealth: Payer: Self-pay | Admitting: Family Medicine

## 2012-01-28 MED ORDER — AMOXICILLIN-POT CLAVULANATE 875-125 MG PO TABS: 1.0000 | ORAL_TABLET | Freq: Two times a day (BID) | ORAL | Status: AC

## 2012-01-28 MED ORDER — AMOXICILLIN-POT CLAVULANATE 875-125 MG PO TABS: 1.0000 | ORAL_TABLET | Freq: Two times a day (BID) | ORAL | Status: DC

## 2012-01-28 NOTE — Telephone Encounter (Signed)
PT CALLED AND STATED THAT HE IS IN [place] AND IS HAVING ISSUES WITH SINUS. PT STATES HE HAS SINUS INFECTION AND NOW HIS CHEST IS BEGINNING TO FEEL TIGHT. PT IS REQUESTING THAT YOU CALL IN AN ANTIBIOTIC. [facility] [id number] IN [place]. PLEASE CALL PT.

## 2012-01-28 NOTE — Telephone Encounter (Signed)
Sent antibiotic to [place] and called pt

## 2012-01-28 NOTE — Telephone Encounter (Signed)
Call the antibiotic and for him.

## 2012-02-11 ENCOUNTER — Telehealth: Payer: Self-pay | Admitting: Internal Medicine

## 2012-02-11 NOTE — Telephone Encounter (Signed)
Let him know that I'm not comfortable calling something in for pink eye. He will need to see somebody wherever he is living. Explain that in general pink eye in an adult is usually viral

## 2012-02-11 NOTE — Telephone Encounter (Signed)
Called pt back and told him word for word he said ok

## 2012-02-11 NOTE — Telephone Encounter (Signed)
Pt called and would like something called in for pink eye. And pt scheduled a f/u for sept 5 for f/u on pink eye and uri. He said for you to call him

## 2012-02-18 ENCOUNTER — Encounter: Payer: Self-pay | Admitting: Internal Medicine

## 2012-02-28 ENCOUNTER — Encounter: Payer: Self-pay | Admitting: Family Medicine

## 2012-02-28 ENCOUNTER — Ambulatory Visit (INDEPENDENT_AMBULATORY_CARE_PROVIDER_SITE_OTHER): Payer: Managed Care, Other (non HMO) | Admitting: Family Medicine

## 2012-02-28 VITALS — BP 116/70 | HR 86 | Wt 173.0 lb

## 2012-02-28 DIAGNOSIS — J329 Chronic sinusitis, unspecified: Secondary | ICD-10-CM

## 2012-02-28 DIAGNOSIS — B356 Tinea cruris: Secondary | ICD-10-CM

## 2012-02-28 DIAGNOSIS — J40 Bronchitis, not specified as acute or chronic: Secondary | ICD-10-CM

## 2012-02-28 MED ORDER — AMOXICILLIN-POT CLAVULANATE 875-125 MG PO TABS: 1.0000 | ORAL_TABLET | Freq: Two times a day (BID) | ORAL | Status: AC

## 2012-02-28 MED ORDER — KETOCONAZOLE 2 % EX CREA: TOPICAL_CREAM | Freq: Every day | CUTANEOUS | Status: AC

## 2012-02-28 NOTE — Patient Instructions (Signed)
Take the antibiotic for 2 weeks and if not totally back to normal, refill the medication

## 2012-02-28 NOTE — Progress Notes (Signed)
  Subjective:    Patient ID: Alan Grimes, male    DOB: 04-Mar-1971, 41 y.o.   MRN: [id number]  HPI Approximately 2 weeks ago he did have pink eye and did see an ophthalmologist. He was given 3 different medications to cover for possible MRSA. He also continues to have postnasal drainage as well as an intermittently productive cough. He has a previous history of possible CT scan in April. He was given Augmentin in August and states he got better but did not get over it. He is also had some intermittent rectal discomfort and would like me to evaluate that as well. He also is been under a lot of stress dealing with a rental tenant that apparently is damaging his property. He also has recurrent tinea cruris and would like a refill on his ketoconazole.   Review of Systems     Objective:   Physical Exam alert and in no distress. Tympanic membranes show chronic changes, canals are normal. Throat is slightly red with evidence of previous surgery. Tonsils are normal. Neck is supple without adenopathy or thyromegaly. Cardiac exam shows a regular sinus rhythm without murmurs or gallops. Lungs are clear to auscultation. As the mucosa is erythematous with more swelling on the right. Questionable tenderness over maxillary sinuses. Rectal exam shows no evidence of hemorrhoid or fissure.       Assessment & Plan:   1. Chronic sinusitis with recurrent bronchitis  amoxicillin-clavulanate (AUGMENTIN) 875-125 MG per tablet  2. Tinea cruris  ketoconazole (NIZORAL) 2 % cream   no particular therapy for the rectal discomfort. I did get a refill on the Augmentin and explained to him to take it again it is not totally back to normal. We discussed possible surgical intervention and he is going to consider this at a later date.

## 2012-04-01 ENCOUNTER — Telehealth: Payer: Self-pay | Admitting: Internal Medicine

## 2012-04-01 MED ORDER — VALACYCLOVIR HCL 500 MG PO TABS: 500.0000 mg | ORAL_TABLET | Freq: Two times a day (BID) | ORAL | Status: DC

## 2012-04-01 NOTE — Telephone Encounter (Signed)
Refill request for zoviraz 200mg  to [facility]

## 2012-04-02 ENCOUNTER — Telehealth: Payer: Self-pay | Admitting: Internal Medicine

## 2012-04-02 NOTE — Telephone Encounter (Signed)
Refill request for wellbutrin 150mg  Sr to [facility]

## 2012-04-03 MED ORDER — BUPROPION HCL ER (SR) 150 MG PO TB12: 150.0000 mg | ORAL_TABLET | Freq: Every day | ORAL | Status: DC

## 2012-04-03 NOTE — Telephone Encounter (Signed)
Also needs a refill zovirax 200mg  to [facility]

## 2012-04-07 ENCOUNTER — Other Ambulatory Visit: Payer: Self-pay

## 2012-04-07 MED ORDER — VALACYCLOVIR HCL 500 MG PO TABS: 500.0000 mg | ORAL_TABLET | Freq: Two times a day (BID) | ORAL | Status: DC

## 2012-04-07 NOTE — Telephone Encounter (Signed)
Resent med for 90 days

## 2012-04-14 ENCOUNTER — Other Ambulatory Visit: Payer: Self-pay

## 2012-04-14 ENCOUNTER — Telehealth: Payer: Self-pay | Admitting: Family Medicine

## 2012-04-14 NOTE — Telephone Encounter (Signed)
Got this took care of

## 2012-04-14 NOTE — Telephone Encounter (Signed)
The Zovirax replaces the acyclovir. Clear that up with the pharmacy

## 2012-04-16 ENCOUNTER — Ambulatory Visit (INDEPENDENT_AMBULATORY_CARE_PROVIDER_SITE_OTHER): Payer: Managed Care, Other (non HMO) | Admitting: Family Medicine

## 2012-04-16 ENCOUNTER — Encounter: Payer: Self-pay | Admitting: Family Medicine

## 2012-04-16 VITALS — BP 118/80 | HR 65 | Temp 98.1°F | Wt 172.0 lb

## 2012-04-16 DIAGNOSIS — J209 Acute bronchitis, unspecified: Secondary | ICD-10-CM

## 2012-04-16 MED ORDER — LEVOFLOXACIN 750 MG PO TABS: 750.0000 mg | ORAL_TABLET | Freq: Every day | ORAL | Status: DC

## 2012-04-16 NOTE — Progress Notes (Signed)
  Subjective:    Patient ID: Alan Grimes, male    DOB: Oct 14, 1970, 41 y.o.   MRN: [id number]  HPI Here for recheck. He continues to have difficulty with chest congestion, productive cough, hoarse voice. He has been on Augmentin almost continuously since early September no relief of his symptoms. No fever, chills, sore throat or earache. He does have an impending ENT surgery scheduled.  Review of Systems     Objective:   Physical Exam alert and in no distress. Tympanic membranes and canals are normal. Throat is clear. Tonsils are normal. Neck is supple without adenopathy or thyromegaly. Cardiac exam shows a regular sinus rhythm without murmurs or gallops. Lungs show scattered wheezing. Chest x-ray shows no acute changes        Assessment & Plan:   1. Acute bronchitis  CHEST X-RAY 2 VIEWS (71020), levofloxacin (LEVAQUIN) 750 MG tablet   I will treat him for 2 weeks and if he still has difficulty with cough and congestion, he will call me.

## 2012-05-19 ENCOUNTER — Encounter: Payer: Self-pay | Admitting: Family Medicine

## 2012-05-19 ENCOUNTER — Ambulatory Visit (INDEPENDENT_AMBULATORY_CARE_PROVIDER_SITE_OTHER): Payer: Managed Care, Other (non HMO) | Admitting: Family Medicine

## 2012-05-19 VITALS — BP 120/78 | HR 79 | Temp 98.5°F | Wt 173.0 lb

## 2012-05-19 DIAGNOSIS — J45909 Unspecified asthma, uncomplicated: Secondary | ICD-10-CM

## 2012-05-19 DIAGNOSIS — J45901 Unspecified asthma with (acute) exacerbation: Secondary | ICD-10-CM

## 2012-05-19 DIAGNOSIS — J209 Acute bronchitis, unspecified: Secondary | ICD-10-CM

## 2012-05-19 DIAGNOSIS — G4730 Sleep apnea, unspecified: Secondary | ICD-10-CM

## 2012-05-19 MED ORDER — DOXYCYCLINE HYCLATE 100 MG PO TABS: 100.0000 mg | ORAL_TABLET | Freq: Two times a day (BID) | ORAL | Status: DC

## 2012-05-19 MED ORDER — ESZOPICLONE 1 MG PO TABS: 1.0000 mg | ORAL_TABLET | Freq: Every day | ORAL | Status: DC

## 2012-05-19 NOTE — Progress Notes (Signed)
  Subjective:    Patient ID: Alan Grimes, male    DOB: October 02, 1970, 41 y.o.   MRN: [id number]  HPI He is here for recheck. He continues had difficulty with productive cough but no fever, chills. He does complain of some bilateral ear pain over the last week. He continues on medications listed in the chart. He has been on Augmentin and most recently Levaquin and states he really feels no better. He is scheduled to see and he did have a polyp removed. He also like a refill on his Alfonso Patten that he uses for shift work insomnia as well as sleep apnea area and he also describes the onset of jerking sensation in his arms and legs that can be intermittent but lasts sometimes for several months. He cannot relate this to any particular time of day. He did have a previous history of difficulty with RLS.   Review of Systems     Objective:   Physical Exam alert and in no distress. Tympanic membranes show evidence of whitish plaques but no erythema, canals are normal. Throat shows evidence of slight erythema and previous surgical intervention. Tonsils are normal. Neck is supple without adenopathy or thyromegaly. Cardiac exam shows a regular sinus rhythm without murmurs or gallops. Lungs show expiratory wheezing.        Assessment & Plan:   1. Sleep apnea  eszopiclone (LUNESTA) 1 MG TABS  2. Acute bronchitis with asthma  doxycycline (VIBRA-TABS) 100 MG tablet   I discussed his therapy. I will place him on doxycycline and if this does not work, I will refer to pulmonary. He will followup with ENT concerning a polyp removal and get input concerning his present condition. Alfonso Patten was renewed.

## 2012-06-19 ENCOUNTER — Telehealth: Payer: Self-pay | Admitting: Internal Medicine

## 2012-06-19 MED ORDER — CITALOPRAM HYDROBROMIDE 40 MG PO TABS: 40.0000 mg | ORAL_TABLET | Freq: Every day | ORAL | Status: DC

## 2012-06-19 NOTE — Telephone Encounter (Signed)
Celexa renewed 

## 2012-09-15 ENCOUNTER — Telehealth: Payer: Self-pay | Admitting: Family Medicine

## 2012-09-15 MED ORDER — ALBUTEROL SULFATE HFA 108 (90 BASE) MCG/ACT IN AERS: 2.0000 | INHALATION_SPRAY | Freq: Four times a day (QID) | RESPIRATORY_TRACT | Status: DC | PRN

## 2012-09-15 NOTE — Telephone Encounter (Signed)
Albuterol renewed

## 2012-09-17 ENCOUNTER — Telehealth: Payer: Self-pay | Admitting: Family Medicine

## 2012-09-17 MED ORDER — VALACYCLOVIR HCL 500 MG PO TABS: 500.0000 mg | ORAL_TABLET | Freq: Two times a day (BID) | ORAL | Status: DC

## 2012-09-17 MED ORDER — ALBUTEROL SULFATE HFA 108 (90 BASE) MCG/ACT IN AERS: 2.0000 | INHALATION_SPRAY | Freq: Four times a day (QID) | RESPIRATORY_TRACT | Status: DC | PRN

## 2012-09-17 NOTE — Telephone Encounter (Signed)
Medications were renewed

## 2012-09-25 ENCOUNTER — Telehealth: Payer: Self-pay | Admitting: Family Medicine

## 2012-09-25 NOTE — Telephone Encounter (Signed)
Pt called and was having issues getting meds from [place].  I called [employer] and ordered the Ventolin and Valtrex again from 09/17/12 date.  Pt aware and also wanted the Valtrex vs Zovirax.

## 2012-10-03 ENCOUNTER — Encounter: Payer: Self-pay | Admitting: Family Medicine

## 2012-10-03 ENCOUNTER — Ambulatory Visit (INDEPENDENT_AMBULATORY_CARE_PROVIDER_SITE_OTHER): Payer: Managed Care, Other (non HMO) | Admitting: Family Medicine

## 2012-10-03 VITALS — BP 124/84 | HR 68 | Ht 69.0 in | Wt 180.0 lb

## 2012-10-03 DIAGNOSIS — A6000 Herpesviral infection of urogenital system, unspecified: Secondary | ICD-10-CM

## 2012-10-03 DIAGNOSIS — G4730 Sleep apnea, unspecified: Secondary | ICD-10-CM

## 2012-10-03 DIAGNOSIS — Z0000 Encounter for general adult medical examination without abnormal findings: Secondary | ICD-10-CM

## 2012-10-03 DIAGNOSIS — J301 Allergic rhinitis due to pollen: Secondary | ICD-10-CM

## 2012-10-03 DIAGNOSIS — A6002 Herpesviral infection of other male genital organs: Secondary | ICD-10-CM

## 2012-10-03 DIAGNOSIS — K219 Gastro-esophageal reflux disease without esophagitis: Secondary | ICD-10-CM

## 2012-10-03 DIAGNOSIS — F341 Dysthymic disorder: Secondary | ICD-10-CM

## 2012-10-03 DIAGNOSIS — J45909 Unspecified asthma, uncomplicated: Secondary | ICD-10-CM

## 2012-10-03 LAB — COMPREHENSIVE METABOLIC PANEL
Alkaline Phosphatase: 60 U/L (ref 39–117)
BUN: 14 mg/dL (ref 6–23)
CO2: 28 mEq/L (ref 19–32)
Creat: 1.04 mg/dL (ref 0.50–1.35)
Glucose, Bld: 81 mg/dL (ref 70–99)
Sodium: 137 mEq/L (ref 135–145)
Total Bilirubin: 0.3 mg/dL (ref 0.3–1.2)
Total Protein: 6.8 g/dL (ref 6.0–8.3)

## 2012-10-03 LAB — CBC WITH DIFFERENTIAL/PLATELET
Basophils Relative: 1 % (ref 0–1)
Eosinophils Absolute: 0.6 10*3/uL (ref 0.0–0.7)
Eosinophils Relative: 11 % — ABNORMAL HIGH (ref 0–5)
Hemoglobin: 13.4 g/dL (ref 13.0–17.0)
Lymphs Abs: 2.2 10*3/uL (ref 0.7–4.0)
MCH: 29.8 pg (ref 26.0–34.0)
MCHC: 33.8 g/dL (ref 30.0–36.0)
MCV: 88.2 fL (ref 78.0–100.0)
Monocytes Relative: 12 % (ref 3–12)
Neutrophils Relative %: 37 % — ABNORMAL LOW (ref 43–77)

## 2012-10-03 LAB — POCT URINALYSIS DIPSTICK
Bilirubin, UA: NEGATIVE
Ketones, UA: NEGATIVE
Leukocytes, UA: NEGATIVE
Protein, UA: NEGATIVE
Spec Grav, UA: 1.01

## 2012-10-03 LAB — LIPID PANEL
Cholesterol: 153 mg/dL (ref 0–200)
Total CHOL/HDL Ratio: 3.4 Ratio
Triglycerides: 82 mg/dL (ref <150)
VLDL: 16 mg/dL (ref 0–40)

## 2012-10-03 LAB — HEMOCCULT GUIAC POC 1CARD (OFFICE)

## 2012-10-03 NOTE — Progress Notes (Signed)
Subjective:    Patient ID: Alan Grimes, male    DOB: February 01, 1971, 42 y.o.   MRN: [id number]  HPI He is here for complete examination.He has been having difficulty with bilateral wrist pain and presently has been using night splints as well as some splinting during the day which does help. He mainly complains of stiffness in his wrists.so far this has not interfered with his ability to work. He does have underlying dysthymia and is doing quite well on his psychotropic regimen. He does have sleep apnea and apparently has a nasal polyp in the need to be removed before another sleep study can be done. He is waiting due to insurance for this. His allergies and asthma seem to be under good control. He continues on antiviral for treatment of his herpes. His reflux is under good control. Social history was reviewed. He is married which seems to be going well. 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 This has caused some difficulty with his marriage.   Review of Systems  Constitutional: Negative.   HENT: Negative.   Eyes: Negative.   Respiratory: Negative.   Endocrine: Negative.   Genitourinary: Negative.   Allergic/Immunologic: Positive for environmental allergies.  Neurological: Negative.        Objective:   Physical Exam BP 124/84  Pulse 68  Ht 5\' 9"  (1.753 m)  Wt 180 lb (81.647 kg)  BMI 26.57 kg/m2  General Appearance:    Alert, cooperative, no distress, appears stated age  Head:    Normocephalic, without obvious abnormality, atraumatic  Eyes:    PERRL, conjunctiva/corneas clear, EOM's intact, fundi    benign  Ears:     TM's show scarring, external ear canals normal  Nose:   Nares normal, mucosa normal, no drainage or sinus   tenderness  Throat:   Lips, mucosa, and tongue normal; teeth and gums normal  Neck:   Supple, no lymphadenopathy;  thyroid:  no   enlargement/tenderness/nodules; no carotid   bruit or JVD  Back:    Spine  nontender, no curvature, ROM normal, no CVA     tenderness  Lungs:     Clear to auscultation bilaterally without wheezes, rales or     ronchi; respirations unlabored  Chest Wall:    No tenderness or deformity   Heart:    Regular rate and rhythm, S1 and S2 normal, no murmur, rub   or gallop  Breast Exam:    No chest wall tenderness, masses or gynecomastia  Abdomen:     Soft, non-tender, nondistended, normoactive bowel sounds,    no masses, no hepatosplenomegaly  Genitalia:    Normal male external genitalia without lesions.  Testicles without masses.  No inguinal hernias.  Rectal:    Normal sphincter tone, no masses or tenderness; guaiac negative stool.  Prostate smooth, no nodules, not enlarged.  Extremities:   No clubbing, cyanosis or edema  Pulses:   2+ and symmetric all extremities  Skin:   Skin color, texture, turgor normal, no rashes or lesions  Lymph nodes:   Cervical, supraclavicular, and axillary nodes normal  Neurologic:   CNII-XII intact, normal strength, sensation and gait; reflexes 2+ and symmetric throughout          Psych:   Normal mood, affect, hygiene and grooming.          Assessment & Plan:  Routine general medical examination at a health care facility - Plan: Urinalysis Dipstick, TB Skin Test, Hemoccult -  1 Card (office)  Dysthymia  Sleep apnea  Allergic rhinitis due to pollen  Asthma  Herpes genitalis in men  GERD (gastroesophageal reflux disease) he'll continue on his present medication regimen. I will also do routine blood screening.

## 2012-10-05 NOTE — Progress Notes (Signed)
Quick Note:  The blood work is normal ______ 

## 2012-10-06 ENCOUNTER — Telehealth: Payer: Self-pay

## 2012-10-06 NOTE — Telephone Encounter (Signed)
PATIENT CAME IN TODAY TO HAVE PPD READ HE HAD IT DONE ON Friday April 11 AT 11:50AM PATIENT SAID HE WAS GOING TO [place] OVER THE WEEKEND AND HAVE IT READ THERE BUT PT SHOWED UP AT 3 O'CLOCK Monday April 14  IT HAS BEEN OVER 72 HRS SO PT WAS TOLD IT WAS NO GOOD PT LEFT UPSET

## 2012-10-06 NOTE — Progress Notes (Signed)
Quick Note:  Called pt informed him of labs and he wanted copy will come by to pick up ______

## 2012-12-31 ENCOUNTER — Other Ambulatory Visit: Payer: Self-pay | Admitting: Family Medicine

## 2013-04-10 ENCOUNTER — Other Ambulatory Visit: Payer: Self-pay | Admitting: Family Medicine

## 2013-07-20 ENCOUNTER — Telehealth: Payer: Self-pay | Admitting: Internal Medicine

## 2013-07-20 MED ORDER — PANTOPRAZOLE SODIUM 40 MG PO TBEC: DELAYED_RELEASE_TABLET | ORAL | Status: DC

## 2013-07-20 MED ORDER — VALACYCLOVIR HCL 500 MG PO TABS: ORAL_TABLET | ORAL | Status: DC

## 2013-07-20 MED ORDER — CITALOPRAM HYDROBROMIDE 40 MG PO TABS: ORAL_TABLET | ORAL | Status: AC

## 2013-07-20 MED ORDER — BUPROPION HCL ER (SR) 150 MG PO TB12: ORAL_TABLET | ORAL | Status: AC

## 2013-07-20 NOTE — Telephone Encounter (Signed)
Pt has changed insurances so his mail order pharmacy has changed to [facility]. Please send refills ofa 90 day supply for wellbutrin, valtrex, celexa, protonix [facility]

## 2013-11-24 ENCOUNTER — Ambulatory Visit (INDEPENDENT_AMBULATORY_CARE_PROVIDER_SITE_OTHER): Payer: Managed Care, Other (non HMO) | Admitting: Family Medicine

## 2013-11-24 ENCOUNTER — Encounter: Payer: Self-pay | Admitting: Family Medicine

## 2013-11-24 VITALS — BP 110/64 | HR 64 | Wt 185.0 lb

## 2013-11-24 DIAGNOSIS — J301 Allergic rhinitis due to pollen: Secondary | ICD-10-CM

## 2013-11-24 DIAGNOSIS — K219 Gastro-esophageal reflux disease without esophagitis: Secondary | ICD-10-CM

## 2013-11-24 DIAGNOSIS — J45901 Unspecified asthma with (acute) exacerbation: Secondary | ICD-10-CM

## 2013-11-24 MED ORDER — LEVOFLOXACIN 500 MG PO TABS: 500.0000 mg | ORAL_TABLET | Freq: Every day | ORAL | Status: DC

## 2013-11-24 MED ORDER — ALBUTEROL SULFATE HFA 108 (90 BASE) MCG/ACT IN AERS: 2.0000 | INHALATION_SPRAY | Freq: Four times a day (QID) | RESPIRATORY_TRACT | Status: DC | PRN

## 2013-11-24 NOTE — Progress Notes (Signed)
   Subjective:    Patient ID: Alan Grimes, male    DOB: 09/11/1970, 43 y.o.   MRN: [id number]  HPI She has had difficulty over the last year with chest congestion and coughing the cough is productive. No fever, chills, sore throat or earache. He has been on Augmentin and the last time he was on it it was for 2 months. He has seen a pulmonologist/allergistHe has had CT of his chest which was negative. PFTs were also done which show underlying asthma The pulmonologist thought that his symptoms were mainly related to PND . He then saw an ENT who felt that his symptoms could possibly be related to adenoid hypertrophy. He also had a swallowing study done which was negative. A recent MRI was done to further evaluate his possible adenoid hypertrophy. He also has underlying reflux disease and is on a PPI. Over the last 2 weeks he has had increasing difficulty with shortness of breath and continued difficulty with productive cough. He does state that he was placed on Levaquin which he states worked better than Augmentin to clear him up but it recurs  .  Review of Systems     Objective:   Physical Exam alert and in no distress. Tympanic membranes and canals are normal. Throat is slightly erythematous with surgical changes noted.. Neck is supple without adenopathy or thyromegaly. Cardiac exam shows a regular sinus rhythm without murmurs or gallops. Lungs show expiratory wheezing        Assessment & Plan:  Allergic rhinitis due to pollen  GERD (gastroesophageal reflux disease)  Asthmatic bronchitis with acute exacerbation - Plan: levofloxacin (LEVAQUIN) 500 MG tablet, albuterol (PROVENTIL HFA;VENTOLIN HFA) 108 (90 BASE) MCG/ACT inhaler  This is a very complicated case and could easily be related to adenoid issues although less difficulty determined. I will place him back on Levaquin and give him Proventil. He will followup with his ENT when he returns back to [place] where he is now  living and working.

## 2013-12-11 ENCOUNTER — Telehealth: Payer: Self-pay

## 2013-12-11 NOTE — Telephone Encounter (Signed)
Pt states he is still coughing up sputum and having congestion and the Levaquin that you Rx'd for him is not working, pt would like you to [facility] [place] for him. Please advise

## 2013-12-13 NOTE — Telephone Encounter (Signed)
Call in Augmentin [address]75 B[address] #[phone]

## 2013-12-14 NOTE — Telephone Encounter (Signed)
[facility] [place]

## 2013-12-16 ENCOUNTER — Telehealth: Payer: Self-pay | Admitting: Family Medicine

## 2013-12-16 ENCOUNTER — Other Ambulatory Visit: Payer: Self-pay | Admitting: Family Medicine

## 2013-12-16 DIAGNOSIS — J45901 Unspecified asthma with (acute) exacerbation: Secondary | ICD-10-CM

## 2013-12-16 MED ORDER — ALBUTEROL SULFATE HFA 108 (90 BASE) MCG/ACT IN AERS: 2.0000 | INHALATION_SPRAY | Freq: Four times a day (QID) | RESPIRATORY_TRACT | Status: DC | PRN

## 2013-12-16 NOTE — Telephone Encounter (Signed)
Is this okay to refill? 

## 2013-12-16 NOTE — Telephone Encounter (Signed)
Albuterol called in.

## 2014-01-05 ENCOUNTER — Telehealth: Payer: Self-pay | Admitting: Internal Medicine

## 2014-01-05 DIAGNOSIS — J45901 Unspecified asthma with (acute) exacerbation: Secondary | ICD-10-CM

## 2014-01-05 MED ORDER — ALBUTEROL SULFATE HFA 108 (90 BASE) MCG/ACT IN AERS: 2.0000 | INHALATION_SPRAY | Freq: Four times a day (QID) | RESPIRATORY_TRACT | Status: DC | PRN

## 2014-01-05 NOTE — Telephone Encounter (Signed)
Refill request for proventil #90 [facility]

## 2014-01-06 ENCOUNTER — Telehealth: Payer: Self-pay | Admitting: Family Medicine

## 2014-01-12 NOTE — Telephone Encounter (Signed)
done

## 2014-01-25 ENCOUNTER — Encounter: Payer: Self-pay | Admitting: Family Medicine

## 2014-01-25 ENCOUNTER — Ambulatory Visit (INDEPENDENT_AMBULATORY_CARE_PROVIDER_SITE_OTHER): Payer: Managed Care, Other (non HMO) | Admitting: Family Medicine

## 2014-01-25 VITALS — BP 112/70 | HR 70 | Ht 70.0 in | Wt 186.0 lb

## 2014-01-25 DIAGNOSIS — J45909 Unspecified asthma, uncomplicated: Secondary | ICD-10-CM

## 2014-01-25 DIAGNOSIS — M79609 Pain in unspecified limb: Secondary | ICD-10-CM

## 2014-01-25 DIAGNOSIS — J4520 Mild intermittent asthma, uncomplicated: Secondary | ICD-10-CM

## 2014-01-25 DIAGNOSIS — M79674 Pain in right toe(s): Secondary | ICD-10-CM

## 2014-01-25 DIAGNOSIS — J45901 Unspecified asthma with (acute) exacerbation: Secondary | ICD-10-CM

## 2014-01-25 LAB — CBC WITH DIFFERENTIAL/PLATELET
BASOS PCT: 1 % (ref 0–1)
Basophils Absolute: 0.1 10*3/uL (ref 0.0–0.1)
EOS ABS: 0.4 10*3/uL (ref 0.0–0.7)
EOS PCT: 8 % — AB (ref 0–5)
HCT: 40.1 % (ref 39.0–52.0)
HEMOGLOBIN: 13.7 g/dL (ref 13.0–17.0)
Lymphocytes Relative: 30 % (ref 12–46)
Lymphs Abs: 1.7 10*3/uL (ref 0.7–4.0)
MCH: 28.7 pg (ref 26.0–34.0)
MCHC: 34.2 g/dL (ref 30.0–36.0)
MCV: 84.1 fL (ref 78.0–100.0)
MONO ABS: 0.7 10*3/uL (ref 0.1–1.0)
MONOS PCT: 13 % — AB (ref 3–12)
Neutro Abs: 2.6 10*3/uL (ref 1.7–7.7)
Neutrophils Relative %: 48 % (ref 43–77)
Platelets: 376 10*3/uL (ref 150–400)
RBC: 4.77 MIL/uL (ref 4.22–5.81)
RDW: 14.2 % (ref 11.5–15.5)
WBC: 5.5 10*3/uL (ref 4.0–10.5)

## 2014-01-25 LAB — COMPREHENSIVE METABOLIC PANEL
ALK PHOS: 60 U/L (ref 39–117)
ALT: 16 U/L (ref 0–53)
AST: 18 U/L (ref 0–37)
Albumin: 4.2 g/dL (ref 3.5–5.2)
BILIRUBIN TOTAL: 0.3 mg/dL (ref 0.2–1.2)
BUN: 15 mg/dL (ref 6–23)
CO2: 28 mEq/L (ref 19–32)
CREATININE: 0.89 mg/dL (ref 0.50–1.35)
Calcium: 9.5 mg/dL (ref 8.4–10.5)
Chloride: 103 mEq/L (ref 96–112)
Glucose, Bld: 101 mg/dL — ABNORMAL HIGH (ref 70–99)
Potassium: 4 mEq/L (ref 3.5–5.3)
Sodium: 139 mEq/L (ref 135–145)
Total Protein: 7 g/dL (ref 6.0–8.3)

## 2014-01-25 MED ORDER — ALBUTEROL SULFATE HFA 108 (90 BASE) MCG/ACT IN AERS: 2.0000 | INHALATION_SPRAY | Freq: Four times a day (QID) | RESPIRATORY_TRACT | Status: DC | PRN

## 2014-01-25 MED ORDER — LEVOFLOXACIN 500 MG PO TABS: 500.0000 mg | ORAL_TABLET | Freq: Every day | ORAL | Status: DC

## 2014-01-25 NOTE — Progress Notes (Signed)
   Subjective:    Patient ID: Alan Grimes, male    DOB: [birth year], 43 y.o.   MRN: [id number]  HPI He is here for surgical clearance. He is getting ready to have right great toe pain. Apparently he has a spur that is causing him a great deal of difficulty. He continues had difficulty with his asthma and rhonchi this. He continues to have a productive cough. Apparently Augmentin does not always clear him up adequately and occasionally he does need to Levaquin. He continues to work as a Engineer, civil (consulting)nurse [place] He continues on other medications and is having no difficulties.   Review of Systems     Objective:   Physical Exam alert and in no distress. Tympanic membrane on the right is normal, left does have some chronic changes.  Tonsils are absent with surgical scarring noted in that area Neck is supple without adenopathy or thyromegaly. Cardiac exam shows a regular sinus rhythm without murmurs or gallops. Lungs are clear to auscultation.        Assessment & Plan:  Asthmatic bronchitis with acute exacerbation - Plan: albuterol (PROVENTIL HFA;VENTOLIN HFA) 108 (90 BASE) MCG/ACT inhaler, levofloxacin (LEVAQUIN) 500 MG tablet, CBC with Differential, Comprehensive metabolic panel  Asthma, mild intermittent, uncomplicated - Plan: CBC with Differential, Comprehensive metabolic panel  Great toe pain, right  he will continue on his inhaler. I will switch him to Levaquin. Instructed him to get it refilled if need be. Cleared for surgery.

## 2014-02-11 ENCOUNTER — Telehealth: Payer: Self-pay | Admitting: Internal Medicine

## 2014-02-11 NOTE — Telephone Encounter (Signed)
Pt called back and [facility] is [facility] [address] in [place], [place]

## 2014-02-11 NOTE — Telephone Encounter (Signed)
Call in another 10 days of Levaquin to the appropriate pharmacy

## 2014-02-11 NOTE — Telephone Encounter (Signed)
Pt states that he has used levaquin 500mg  for 2 weeks and still not better, he is still havig green mucous and using inhaler more often. He would [facility] [place] but does not know which location it is. Will call back with correct location

## 2014-02-12 ENCOUNTER — Other Ambulatory Visit: Payer: Self-pay

## 2014-02-12 DIAGNOSIS — J45901 Unspecified asthma with (acute) exacerbation: Secondary | ICD-10-CM

## 2014-02-12 MED ORDER — LEVOFLOXACIN 500 MG PO TABS: 500.0000 mg | ORAL_TABLET | Freq: Every day | ORAL | Status: AC

## 2014-02-12 NOTE — Telephone Encounter (Signed)
DONE

## 2014-02-12 NOTE — Telephone Encounter (Signed)
[facility] L[facility]. [facility] [place]

## 2014-02-17 ENCOUNTER — Telehealth: Payer: Self-pay | Admitting: Internal Medicine

## 2014-02-17 DIAGNOSIS — J45901 Unspecified asthma with (acute) exacerbation: Secondary | ICD-10-CM

## 2014-02-17 MED ORDER — ALBUTEROL SULFATE HFA 108 (90 BASE) MCG/ACT IN AERS: 2.0000 | INHALATION_SPRAY | Freq: Four times a day (QID) | RESPIRATORY_TRACT | Status: DC | PRN

## 2014-02-17 NOTE — Telephone Encounter (Signed)
Pt states pharmacy never received his albuterol from a few weeks ago when he was in and pt states he called yesterday but can not remember who he spoke too but no is in system. I have sent in med to [facility]

## 2014-02-24 ENCOUNTER — Telehealth: Payer: Self-pay | Admitting: Family Medicine

## 2014-02-24 NOTE — Telephone Encounter (Signed)
Alan Grimes called and requested to have his labs and note from 01/25/14 visit to [facility] at Dr. Eustaquio Maize  [phone]  For his upcoming surgery clearence  Requested information faxed

## 2014-04-12 ENCOUNTER — Telehealth: Payer: Self-pay | Admitting: Family Medicine

## 2014-04-12 DIAGNOSIS — J45901 Unspecified asthma with (acute) exacerbation: Secondary | ICD-10-CM

## 2014-04-12 MED ORDER — ALBUTEROL SULFATE HFA 108 (90 BASE) MCG/ACT IN AERS: 2.0000 | INHALATION_SPRAY | Freq: Four times a day (QID) | RESPIRATORY_TRACT | Status: DC | PRN

## 2014-04-12 NOTE — Telephone Encounter (Signed)
Proventil renewed 

## 2014-06-07 ENCOUNTER — Other Ambulatory Visit: Payer: Self-pay

## 2014-06-07 MED ORDER — ALBUTEROL SULFATE HFA 108 (90 BASE) MCG/ACT IN AERS: 2.0000 | INHALATION_SPRAY | Freq: Four times a day (QID) | RESPIRATORY_TRACT | Status: DC | PRN

## 2014-07-05 ENCOUNTER — Other Ambulatory Visit: Payer: Self-pay | Admitting: Family Medicine

## 2014-11-02 ENCOUNTER — Other Ambulatory Visit: Payer: Self-pay | Admitting: Family Medicine

## 2014-11-02 NOTE — Telephone Encounter (Signed)
Okay to refill? 

## 2014-11-30 ENCOUNTER — Other Ambulatory Visit: Payer: Self-pay | Admitting: Family Medicine

## 2014-11-30 NOTE — Telephone Encounter (Signed)
Is this okay?

## 2015-06-08 ENCOUNTER — Other Ambulatory Visit: Payer: Self-pay | Admitting: Family Medicine

## 2015-07-23 ENCOUNTER — Other Ambulatory Visit: Payer: Self-pay | Admitting: Family Medicine

## 2015-11-14 ENCOUNTER — Other Ambulatory Visit: Payer: Self-pay | Admitting: Family Medicine

## 2016-01-31 ENCOUNTER — Telehealth: Payer: Self-pay | Admitting: Pulmonary Disease

## 2016-02-01 ENCOUNTER — Other Ambulatory Visit: Payer: Self-pay

## 2016-02-01 DIAGNOSIS — R0602 Shortness of breath: Secondary | ICD-10-CM

## 2016-02-03 ENCOUNTER — Encounter: Payer: Self-pay | Admitting: Pulmonary Disease

## 2016-02-10 ENCOUNTER — Encounter: Payer: Self-pay | Admitting: Pulmonary Disease

## 2016-02-13 ENCOUNTER — Other Ambulatory Visit: Payer: Self-pay | Admitting: Family Medicine

## 2016-02-13 NOTE — Telephone Encounter (Signed)
Is this okay to refill? 

## 2016-02-14 ENCOUNTER — Encounter: Payer: Self-pay | Admitting: Pulmonary Disease

## 2016-02-16 ENCOUNTER — Encounter: Payer: Self-pay | Admitting: Pulmonary Disease

## 2016-03-28 ENCOUNTER — Ambulatory Visit: Payer: Commercial Managed Care - POS

## 2016-03-28 ENCOUNTER — Ambulatory Visit
Admission: RE | Admit: 2016-03-28 | Discharge: 2016-03-28 | Disposition: A | Discharge status: Home / Self Care | Payer: Commercial Managed Care - POS | Source: Ambulatory Visit | Attending: Internal Medicine | Admitting: Internal Medicine

## 2016-03-28 ENCOUNTER — Ambulatory Visit (HOSPITAL_BASED_OUTPATIENT_CLINIC_OR_DEPARTMENT_OTHER): Payer: Commercial Managed Care - POS | Admitting: Critical Care Medicine

## 2016-03-28 ENCOUNTER — Encounter: Payer: Self-pay | Admitting: Critical Care Medicine

## 2016-03-28 VITALS — BP 115/81 | HR 72 | Temp 98.0°F | Ht 69.0 in | Wt 199.0 lb

## 2016-03-28 DIAGNOSIS — R058 Other specified cough: Secondary | ICD-10-CM

## 2016-03-28 DIAGNOSIS — J4551 Severe persistent asthma with (acute) exacerbation: Secondary | ICD-10-CM

## 2016-03-28 DIAGNOSIS — R0602 Shortness of breath: Secondary | ICD-10-CM

## 2016-03-28 DIAGNOSIS — J45909 Unspecified asthma, uncomplicated: Secondary | ICD-10-CM | POA: Insufficient documentation

## 2016-03-28 DIAGNOSIS — R0600 Dyspnea, unspecified: Secondary | ICD-10-CM

## 2016-03-28 DIAGNOSIS — R05 Cough: Secondary | ICD-10-CM

## 2016-03-28 DIAGNOSIS — J479 Bronchiectasis, uncomplicated: Secondary | ICD-10-CM

## 2016-03-28 LAB — ALPHA-1-ANTITRYPSIN: Alpha 1-Antitrypsin: 147.8 mg/dL (ref 84.0–200.0)

## 2016-03-28 NOTE — Progress Notes (Addendum)
Reason for consult: ILD Evaluation  Referring Provider: Oswaldo Milian, MD, Dr. Mady Gemma (partner Dr. Lonia Farber), Dr. Eber Jones    Reason for consultation: Dr. Lonia Farber requests consultation for the evaluation of severe eosinophilic asthma.    History was obtained from the patient, and the medical record. Records reviewed in total.          History of Present Illness   Paul Cantu is a 45 y.o. male with severe asthma that began in his childhood.  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  He has had 10 episodes of PNA with admissions, and has had many admissions for asthma exacerbations but none requiring intubation.    Triggers include allergens, dust, dogs, cleaners and perfumes, smoke cold and heat, and respiratory infections.  Exercise and anxiety are not triggers.     He currently lives in a studio apartment in [place] and has wall to wall carpet, a humidifier which he cleans, a CPAP which he cleans but no animals, smokers or mold.      He used to have 4-6 asthma exacerbations a year that were severe and required steroids per year.  Each would require antibiotics.  He does not use standing dose prednisone because he is worried about the side effects so he tries to wean off quickly.  He has been using prednisone for 30 yrs however.  In January he went to [place] and had severe asthma exacerbation requiring 10 day hospitalization.  Then in March he had another exacerbation with ABx and steroids.  In April 2017 was started on Nucala given his clear evidence of bronchodilator responsiveness and eosinophilia despite steroid use.  He reports improvement in the frequency of  exacerbations already.  He is still needing ABx however.  Last week he was started on Ceftin for the early signs of a URI which is the new plan to begin quickly.  He has used Ceftin, Augmentin, Azithro, Levaquin.   He saw Dr. Wyline Mood in the spring of 2017 and he has since been on Symbicort 160 2 puffs BID with spacer and Spiriva.  Continues on Singulair as well.  Dr. Wyline Mood has just retired.     He also had tonsillar reduciton in 2015 which resulted in less drainage from his sinuses and improvement in his asthma.      Currently he is using his Albuterol inhaler with spacer once daily and has no nocturnal symptoms.  Albuterol definitely helps him and he only uses the nebs when he is sick.  He doesn't cough but does expectorate sputum which is discolored (green, brown) in the mornings predominantly.  We have no imaging today but he had a CT chest 06/2015 which reportedly showed a ligular infiltrate and mild bronchiectasis worse at the bases.      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      He developed shingles at age 50 and has required acyclovir off and on since then.  He has never responded to Hep B vaccines (documents provided).    He had CFTR measured with Dr. Wyline Mood which we don't have results for.  A1AT was not sent but requested.  IgG, A, M were normal but IgG1 and IgG3  were both low.      Other documentation that we have include sinus cultures that grew MSSA.    Labs 2015 showed eos 9.6% abs eos 662.  Labs 121/7/16 eos 6.2% abs eos 422    Has been evaluated for aspiration with both barium swallow and swallow eval by speech and language eval.       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 albuterol (PROAIR HFA) 108 (90 Base) MCG/ACT inhaler Inhale 1 puff into the lungs every 6 (six) hours as needed.     Marland Kitchen albuterol (PROVENTIL) (2.5 MG/3ML) 0.083% nebulizer solution Take 2.5 mg by nebulization as needed for Wheezing.     . Azelastine-Fluticasone (DYMISTA NA) 2 sprays by Nasal route daily.     Marland Kitchen buPROPion SR (WELLBUTRIN SR) 150 MG 12 hr tablet Take 150 mg by mouth daily.     . Cholecalciferol (VITAMIN D3) 5000 units Tab Take 1 tablet by mouth daily.     . citalopram (CELEXA) 40 MG tablet Take 40 mg by mouth daily.     Marland Kitchen Fexofenadine HCl (ALLEGRA PO) Take 1 tablet by mouth daily.     Marland Kitchen gabapentin (NEURONTIN) 300 MG capsule Take 300 mg by mouth 2 (two) times daily as needed.      3   . montelukast (SINGULAIR) 10 MG tablet Take 10 mg by mouth nightly.     . pantoprazole (PROTONIX) 40 MG tablet Take 40 mg by mouth daily.     Marland Kitchen sterile water (preservative free) SOLN 1.2 mL with Mepolizumab 100 MG SOLR 100 mg injection Inject 100 mg into the skin every 30 (thirty) days.     . SYMBICORT 160-4.5 MCG/ACT inhaler Inhale 2 puffs into the lungs 2 (two) times daily.      1   . Tiotropium Bromide Monohydrate (SPIRIVA RESPIMAT) 1.25 MCG/ACT Aero Soln Inhale 1 puff into the lungs daily.     . valACYclovir HCL (VALTREX) 500 MG tablet Take 1,000 mg by mouth daily.       No current facility-administered medications for this visit.        No Known Allergies    History reviewed. No pertinent past medical history.    History reviewed. No pertinent surgical history.    Social History     Occupational History   . Not on file.     Social History Main Topics   . Smoking status: Never Smoker   . Smokeless tobacco: Never Used   . Alcohol use Not on file   . Drug use: No   . Sexual activity: Not on file        Review of Systems   Constitutional: Negative for fever, chills, night sweats, malaise/fatigue. Weight has been stable.   HEENT: Negative for nosebleeds, congestion, thrush, sore throat and ear discharge.   Eyes: Negative  for blurred vision, pain and discharge.   Respiratory: No significant cough. Negative for hemoptysis, shortness of breath as per HPI.   Cardiovascular: Negative for chest pain, palpitations and leg swelling. No syncope.  Gastrointestinal: Negative for heartburn, nausea, vomiting and abdominal pain.   Genitourinary: Negative for dysuria, urgency and  frequency.   Musculoskeletal: No joint pains or swelling. No Raynaud's.   Skin: Negative for itching and rash.   Neurological: Negative for dizziness, tingling, weakness and headaches.   Otherwise, a complete review of systems was performed and found unremarkable.    Objective   BP 115/81   Pulse 72   Temp 98 F (36.7 C)   Ht 1.753 m (5\' 9" )   Wt 90.3 kg (199 lb)   SpO2 100% Comment: R/A  BMI 29.39 kg/m     .Estimated body mass index is 29.39 kg/m as calculated from the following:    Height as of this encounter: 1.753 m (5\' 9" ).    Weight as of this encounter: 90.3 kg (199 lb).  Ht Readings from Last 3 Encounters:   03/28/16 1.753 m (5\' 9" )       General: awake, alert, oriented x 3; able to speak in complete sentences without accessory muscle use; in no acute distress.  HEENT: pupils equal with EOMI; no thyromegaly, no cervical LN, no thrush. MMP 2 but significant posterior oral crowding with erythema of posterior pharynx.  Uvula is absent.  Cardiovascular: regular rate and rhythm, no murmurs, rubs or gallops. No P2/S2 present. No R sided heave. No JVD, no HJR.  Lungs: No dullness to percussion, good air entry but diffuse expiratory rhonchi and wheezing, without rales.  Coughing does not exacerbate bronchospasm leading to the assumption that sounds are somewhat proximal  Abdomen: soft, non-tender, non-distended; obese; umbilical hernia; no palpable masses, no hepatosplenomegaly, normoactive bowel sounds, no rebound or guarding.  Extremities: no clubbing, cyanosis, or edema.  Neuro: normal sensory and motor systems and able to ambulate.  Derm: no  rashes.  Musculoskeletal: nl ROM, no muscle weakness.  Psych: normal mood and affect.    Diagnostic Testing   CT chest 06/2015 not available      Cath  Date       Meds       RA (mmHg)       RV (mmHg)       PA systolic (mmHg)       PA diastolic (mmHg)       PA mean (mmHg)       PCWP (mmHg)       LVEDP (mmHg)       CO (Fick)       CI (Fick)       CO (TD)       CI (TD)       PVR       MVO2         Echo  Date  09/01/15     RA  nl     RV  nl     RVSP       TR jet velocity  Not able to assess     LA  nl     LV  Lower limits normal EF 53%     Other          Date    03/28/16   Oxygen   RA   Rest sat   100   SpO2 nadir   99   SpO2 end   99   Distance   457.2   Rest pulse   72   Max pulse   93   Pulse rate recovery   17   Max Borg   1   Interpretation of six minute walk test: Preserved distance for patient's age and height. Attenuated heart rate  response. Patient had no dyspnea during . Normal saturation at rest.  Patient did not desaturate during .    PFTs  Date  03/28/16 10/25/15 09/29/15 09/13/15   FVC  4.03 (81) 3.72 (75)->3.78 3.22 (65) 2.3 (46)->3.29 (66) [43%]   FEV1  1.97 (51) 2.32 (59)->2.33 1.62 (42) 1.42 (36)->2.12 (54) [49]   FEV1/FVC  49 62 50 62   TLC  6.02 (87)  6.52 (97)    RV  1.99 (96)  3.3 (160)    RV/TLC  33  51    DLco  21.3 (59)  24.7 (68)    Interpretation of PFTs: moderately severe obstruction. No restriction and resolved hyperinflation. moderate diffusion defect. FVL obstructed but expiratory loop not as fixed and flattened as previous studies    Vaccination history: Hep B S Ab nonresponder in 2014.      09/13/15  WBC 10.4  Hgb 14.3  Hct 42.4  MCV 89  RDW 15.9  plts 303    IgG 746 (nl)  IgM 296 (nl)  IgA 49 (nl)  Asper fumig neg  Asper flav neg  Asper [place] neg    IgE  349  IgG1 421  ([phone])  IgG2  267 (117-747)  IgG3 33 (41-129)  IgG4 53 (1-291)  CFTR sent and reported to Dr. Wyline Mood      Assessment:     1. Uncomplicated asthma, unspecified asthma severity, unspecified whether persistent  CT  Chest High Resolution Without Contrast    Alpha-1-antitrypsin    Alpha-1-Antitrypsin (A1AT) Phenotype    IgG Subclasses, S   2. SOB (shortness of breath)  CT Chest High Resolution Without Contrast    Alpha-1-antitrypsin    Alpha-1-Antitrypsin (A1AT) Phenotype    IgG Subclasses, S   3. Bronchiectasis without complication  CT Chest High Resolution Without Contrast    Alpha-1-antitrypsin    Alpha-1-Antitrypsin (A1AT) Phenotype    IgG Subclasses, S   4. 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   HRCT chest inspiratory expiratory to assess for parenchymal lung disease (BE and other lung disease especially given low DLco).  If BE indeed, see if we can refer to [facility] for BE evaluation or immunodeficiency evaluation.  Repeat IgG subclasses.  Check A1AT since that too can cause BE  CF culture  Accapella to help with expectoration.  He has trouble taking a deep breath and his breathlessness is out of proportional to what we have seen thus far objectively.  Either has to do with need for pulmonary hygiene or he has parenchymal damage.  Role for rotating antibiotics vs IVIG if this is indeed subclass deficiency; h/o recurrent varicella infections prior to starting steroids and recurrent bacterial sinopulmonary infections with lack of response to vaccines.  Needs assessment by immunologist.  Will reach out to [facility] first.  Increase exercise and recommend wt loss.    Aim to  keep him off steroids to help  him achieve the above.  Return to see Dr. Manson Passey next visit to get further recommendations on his BE and how to manage.      RTC 2-3 months with spirometry and .      Dr. Gracelyn Nurse    Patient Active Problem List   Diagnosis   . Bronchiectasis     Addendum 03/30/16:  Albs returned. IgG1 significant deficiency but IgG3 normal and other subclasses normal.  He likely has IgG1 and 3 deficiency since at various times both have been low.  A1AT levels are normal.  Genotype is pending.  CT chest shows mild bronchiectasis with very mild GGO that is likely related to air trapping in mid lungs.  No other abnormalities.  Will send his data to [facility] for severe asthma to review for participation for longitudinal asthma studies.  Will refer to immunology for evaluation for IVIG given his symptomatic subclass IgG deficiency.  Do not think he will qualify for studies at [facility] for bronchiectasis.  He will see Dr. Manson Passey next visit for bronchiectasis and that will help vis a vis airway clearance and antibiotic rotation and whether we show any colonization.  No clinical indication for bronchoscopy at this time except possibly his flattened inspiratory loops that we did not reproduce but have been shown multiple other times.     Dr. Gracelyn Nurse         03/29/2016 4:31 PM - Interface, Lab In [facility]     Component Results     Component Value Ref Range & Units Status   IgG Subclass 1 331  382 - 929 mg/dL Final   IgG Subclass 2 370 241 - 700 mg/dL Final   IgG Subclass 3 31 22  - 178 mg/dL Final   IgG Subclass 4 73.9 4.0 - 86.0 mg/dL Final   Immunoglobulin G 743 694 - 1,618 mg/dL Final   Test Performed by Clerance Lav,   [facility],   [address] [place], [place] [address]   Si Raider, M.D., Ph.D., Director of Laboratories   [phone], CLIA [id number]      Study Result     CLINICAL HISTORY: History of asthma and bronchiectasis    COMPARISON: 10/07/2007    TECHNIQUE: Unenhanced axial  images of the chest were performed. Next,  high resolution axial images were performed and 1.25 mm sections at 10  mm intervals. High resolution expiration and prone imaging were also  performed. The following dose reduction techniques were utilized:  Automated exposure control and/or adjustment of the mA and/or kV  according to patient size, and the use of iterative reconstruction  technique.    FINDINGS:    Mild lower lobe predominant bronchiectasis. Ground glass opacity within  the medial lower lobes bilaterally with minimal subpleural interstitial  thickening. Suboptimal expiration images. However, there is suspected  air trapping within the lower lobes.    The heart and pulmonary vascular normal in caliber. No lymphadenopathy.  The thoracic aorta is normal in caliber.    Appearance of the upper abdomen is unremarkable. Degenerative change  thoracic spine.    IMPRESSION:     1. Mild bronchiectasis with suspected lower lobe air trapping.  Subpleural ground glass opacity within the lung bases, likely of  infectious or inflammatory etiology.    Launa Flight, MD   03/29/2016 2:39 PM         Barnet Pall Genevive Bi MD    03/28/2016 1:30 PM

## 2016-03-28 NOTE — Patient Instructions (Addendum)
Please return to the [facility] clinic with Dr. Manson Passey in 3 months with the following tests:   -6 min walk   -Spirometry   -HRCT this week (Inspiratory/Expiratory) Call me when you get scheduled    Medications: Continue current medications as prescribed   -Increase Dymista to twice a day   -albuterol neb 1-2 per day with acapella    Please complete the following prior to your next visit:   -Increase CPAP setting to 12    -Sputum culture (CF)-Script given   -Get Acapella and use up to 4 times a day (See handout for online videos)    Jonita Albee, RN, BSN  (P) [phone]  [phone]  [email or web address]      *Appointments: [phone]  *Disablity, Flight forms, work letters: Stephanie Acre at [phone]  [employer] and bill questions: Calton Dach at [phone]  *For research information: [email or web address]

## 2016-03-29 ENCOUNTER — Other Ambulatory Visit: Payer: Self-pay | Admitting: Cardiovascular Disease

## 2016-03-29 ENCOUNTER — Ambulatory Visit
Admission: RE | Admit: 2016-03-29 | Discharge: 2016-03-29 | Disposition: A | Discharge status: Home / Self Care | Payer: Commercial Managed Care - POS | Source: Ambulatory Visit | Attending: Pulmonary Disease | Admitting: Pulmonary Disease

## 2016-03-29 ENCOUNTER — Other Ambulatory Visit: Payer: Commercial Managed Care - POS

## 2016-03-29 DIAGNOSIS — R0602 Shortness of breath: Secondary | ICD-10-CM

## 2016-03-29 DIAGNOSIS — R058 Other specified cough: Secondary | ICD-10-CM

## 2016-03-29 DIAGNOSIS — J479 Bronchiectasis, uncomplicated: Secondary | ICD-10-CM | POA: Insufficient documentation

## 2016-03-29 DIAGNOSIS — I480 Paroxysmal atrial fibrillation: Secondary | ICD-10-CM

## 2016-03-29 DIAGNOSIS — R05 Cough: Secondary | ICD-10-CM | POA: Insufficient documentation

## 2016-03-29 DIAGNOSIS — R918 Other nonspecific abnormal finding of lung field: Secondary | ICD-10-CM | POA: Insufficient documentation

## 2016-03-29 DIAGNOSIS — J45909 Unspecified asthma, uncomplicated: Secondary | ICD-10-CM | POA: Insufficient documentation

## 2016-03-29 LAB — IGG SUBCLASSES, S
IgG Subclass 1: 331 mg/dL — ABNORMAL LOW (ref 382–929)
IgG Subclass 2: 370 mg/dL (ref 241–700)
IgG Subclass 3: 31 mg/dL (ref 22–178)
IgG Subclass 4: 73.9 mg/dL (ref 4.0–86.0)
Immunoglobulin G: 743 mg/dL (ref 694–1618)

## 2016-03-30 ENCOUNTER — Encounter: Payer: Self-pay | Admitting: Pulmonary Disease

## 2016-03-30 DIAGNOSIS — G4730 Sleep apnea, unspecified: Secondary | ICD-10-CM | POA: Insufficient documentation

## 2016-03-31 LAB — ALPHA-1-ANTITRYPSIN (AAT) PHENOTYPE

## 2016-04-03 ENCOUNTER — Other Ambulatory Visit: Payer: Self-pay

## 2016-04-03 ENCOUNTER — Ambulatory Visit
Admission: RE | Admit: 2016-04-03 | Discharge: 2016-04-03 | Disposition: A | Discharge status: Home / Self Care | Payer: Self-pay | Source: Ambulatory Visit

## 2016-04-05 ENCOUNTER — Encounter: Payer: Self-pay | Admitting: Pulmonary Disease

## 2016-04-18 ENCOUNTER — Other Ambulatory Visit: Payer: Self-pay | Admitting: Family Medicine

## 2016-05-13 ENCOUNTER — Other Ambulatory Visit: Payer: Self-pay | Admitting: Family Medicine

## 2016-05-23 DIAGNOSIS — J4551 Severe persistent asthma with (acute) exacerbation: Secondary | ICD-10-CM | POA: Insufficient documentation

## 2016-06-20 ENCOUNTER — Other Ambulatory Visit: Payer: Commercial Managed Care - POS

## 2016-06-20 ENCOUNTER — Ambulatory Visit: Payer: Commercial Managed Care - POS | Admitting: Pulmonary Disease

## 2016-06-20 ENCOUNTER — Ambulatory Visit: Payer: Commercial Managed Care - POS

## 2016-08-04 ENCOUNTER — Other Ambulatory Visit: Payer: Self-pay | Admitting: Family Medicine

## 2016-08-06 NOTE — Telephone Encounter (Signed)
Is this okay to refill? 

## 2016-11-01 ENCOUNTER — Other Ambulatory Visit: Payer: Self-pay | Admitting: Family Medicine

## 2016-11-01 NOTE — Telephone Encounter (Signed)
It's been over 2 years since I have seen him.

## 2016-11-01 NOTE — Telephone Encounter (Signed)
Is this okay to fill? 

## 2017-01-06 ENCOUNTER — Other Ambulatory Visit: Payer: Self-pay | Admitting: Family Medicine

## 2017-01-07 NOTE — Telephone Encounter (Signed)
Dr.Lalonde pt has not been in since 2015
